# Patient Record
Sex: Female | Born: 1955 | Race: White | Hispanic: No | Marital: Married | Smoking: Former smoker
Health system: Southern US, Community
[De-identification: ages and names within clinical notes are randomized; demographics above are authoritative.]

## PROBLEM LIST (undated history)

## (undated) DIAGNOSIS — E785 Hyperlipidemia, unspecified: Secondary | ICD-10-CM

## (undated) DIAGNOSIS — I10 Essential (primary) hypertension: Secondary | ICD-10-CM

## (undated) DIAGNOSIS — F32A Depression, unspecified: Secondary | ICD-10-CM

## (undated) DIAGNOSIS — N289 Disorder of kidney and ureter, unspecified: Secondary | ICD-10-CM

## (undated) DIAGNOSIS — K219 Gastro-esophageal reflux disease without esophagitis: Secondary | ICD-10-CM

## (undated) DIAGNOSIS — L0390 Cellulitis, unspecified: Secondary | ICD-10-CM

## (undated) DIAGNOSIS — M1990 Unspecified osteoarthritis, unspecified site: Secondary | ICD-10-CM

## (undated) DIAGNOSIS — F419 Anxiety disorder, unspecified: Secondary | ICD-10-CM

## (undated) DIAGNOSIS — F329 Major depressive disorder, single episode, unspecified: Secondary | ICD-10-CM

## (undated) DIAGNOSIS — Z973 Presence of spectacles and contact lenses: Secondary | ICD-10-CM

## (undated) HISTORY — PX: SIGMOIDOSCOPY: SHX6686

## (undated) HISTORY — PX: COLONOSCOPY: SHX5424

## (undated) HISTORY — PX: WISDOM TOOTH EXTRACTION: SHX21

## (undated) HISTORY — PX: JOINT REPLACEMENT: SHX530

## (undated) HISTORY — PX: APPENDECTOMY: SHX54

## (undated) SURGERY — Surgical Case
Anesthesia: *Unknown

---

## 2001-05-17 ENCOUNTER — Other Ambulatory Visit: Admission: RE | Admit: 2001-05-17 | Discharge: 2001-05-17 | Payer: Self-pay | Admitting: Obstetrics and Gynecology

## 2001-09-13 ENCOUNTER — Other Ambulatory Visit: Admission: RE | Admit: 2001-09-13 | Discharge: 2001-09-13 | Payer: Self-pay | Admitting: Radiology

## 2012-09-18 ENCOUNTER — Encounter (HOSPITAL_COMMUNITY): Payer: Self-pay

## 2012-09-18 ENCOUNTER — Encounter (HOSPITAL_COMMUNITY)
Admission: EM | Disposition: A | Discharge status: Home / Self Care | Payer: Self-pay | Source: Home / Self Care | Attending: General Surgery

## 2012-09-18 ENCOUNTER — Emergency Department (HOSPITAL_COMMUNITY): Payer: BC Managed Care – PPO | Admitting: Anesthesiology

## 2012-09-18 ENCOUNTER — Emergency Department (HOSPITAL_COMMUNITY): Payer: BC Managed Care – PPO

## 2012-09-18 ENCOUNTER — Encounter (HOSPITAL_COMMUNITY): Payer: Self-pay | Admitting: Anesthesiology

## 2012-09-18 ENCOUNTER — Inpatient Hospital Stay (HOSPITAL_COMMUNITY)
Admission: EM | Admit: 2012-09-18 | Discharge: 2012-09-22 | DRG: 883 | Disposition: A | Discharge status: Home / Self Care | Payer: BC Managed Care – PPO | Attending: General Surgery | Admitting: General Surgery

## 2012-09-18 DIAGNOSIS — F411 Generalized anxiety disorder: Secondary | ICD-10-CM | POA: Diagnosis present

## 2012-09-18 DIAGNOSIS — F3289 Other specified depressive episodes: Secondary | ICD-10-CM | POA: Diagnosis present

## 2012-09-18 DIAGNOSIS — F329 Major depressive disorder, single episode, unspecified: Secondary | ICD-10-CM | POA: Diagnosis present

## 2012-09-18 DIAGNOSIS — K219 Gastro-esophageal reflux disease without esophagitis: Secondary | ICD-10-CM | POA: Diagnosis present

## 2012-09-18 DIAGNOSIS — I10 Essential (primary) hypertension: Secondary | ICD-10-CM | POA: Diagnosis present

## 2012-09-18 DIAGNOSIS — K35209 Acute appendicitis with generalized peritonitis, without abscess, unspecified as to perforation: Principal | ICD-10-CM | POA: Diagnosis present

## 2012-09-18 DIAGNOSIS — Z7982 Long term (current) use of aspirin: Secondary | ICD-10-CM

## 2012-09-18 DIAGNOSIS — K3580 Unspecified acute appendicitis: Secondary | ICD-10-CM

## 2012-09-18 DIAGNOSIS — Z79899 Other long term (current) drug therapy: Secondary | ICD-10-CM

## 2012-09-18 DIAGNOSIS — K352 Acute appendicitis with generalized peritonitis, without abscess: Principal | ICD-10-CM | POA: Diagnosis present

## 2012-09-18 HISTORY — PX: LAPAROSCOPIC APPENDECTOMY: SHX408

## 2012-09-18 HISTORY — DX: Essential (primary) hypertension: I10

## 2012-09-18 HISTORY — DX: Major depressive disorder, single episode, unspecified: F32.9

## 2012-09-18 HISTORY — DX: Gastro-esophageal reflux disease without esophagitis: K21.9

## 2012-09-18 HISTORY — DX: Anxiety disorder, unspecified: F41.9

## 2012-09-18 HISTORY — DX: Depression, unspecified: F32.A

## 2012-09-18 LAB — CBC WITH DIFFERENTIAL/PLATELET
Basophils Absolute: 0 10*3/uL (ref 0.0–0.1)
Eosinophils Relative: 0 % (ref 0–5)
HCT: 44.4 % (ref 36.0–46.0)
Lymphocytes Relative: 6 % — ABNORMAL LOW (ref 12–46)
Lymphs Abs: 0.6 10*3/uL — ABNORMAL LOW (ref 0.7–4.0)
MCV: 95.1 fL (ref 78.0–100.0)
Monocytes Absolute: 0.5 10*3/uL (ref 0.1–1.0)
Neutro Abs: 8.7 10*3/uL — ABNORMAL HIGH (ref 1.7–7.7)
RBC: 4.67 MIL/uL (ref 3.87–5.11)
WBC: 9.8 10*3/uL (ref 4.0–10.5)

## 2012-09-18 LAB — COMPREHENSIVE METABOLIC PANEL
ALT: 42 U/L — ABNORMAL HIGH (ref 0–35)
AST: 27 U/L (ref 0–37)
CO2: 28 mEq/L (ref 19–32)
Calcium: 10.2 mg/dL (ref 8.4–10.5)
Chloride: 95 mEq/L — ABNORMAL LOW (ref 96–112)
Creatinine, Ser: 1.1 mg/dL (ref 0.50–1.10)
GFR calc Af Amer: 65 mL/min — ABNORMAL LOW (ref >90)
GFR calc non Af Amer: 56 mL/min — ABNORMAL LOW (ref >90)
Glucose, Bld: 175 mg/dL — ABNORMAL HIGH (ref 70–99)
Sodium: 136 mEq/L (ref 135–145)
Total Bilirubin: 1.2 mg/dL (ref 0.3–1.2)

## 2012-09-18 LAB — URINE MICROSCOPIC-ADD ON

## 2012-09-18 LAB — URINALYSIS, ROUTINE W REFLEX MICROSCOPIC
Ketones, ur: 15 mg/dL — AB
Leukocytes, UA: NEGATIVE
Nitrite: NEGATIVE
Protein, ur: 30 mg/dL — AB
Urobilinogen, UA: 0.2 mg/dL (ref 0.0–1.0)

## 2012-09-18 SURGERY — APPENDECTOMY, LAPAROSCOPIC
Anesthesia: General | Site: Abdomen | Wound class: Dirty or Infected

## 2012-09-18 MED ORDER — ALBUTEROL SULFATE HFA 108 (90 BASE) MCG/ACT IN AERS
INHALATION_SPRAY | RESPIRATORY_TRACT | Status: DC | PRN
  Administered 2012-09-18: 6 via RESPIRATORY_TRACT

## 2012-09-18 MED ORDER — SUCCINYLCHOLINE CHLORIDE 20 MG/ML IJ SOLN
INTRAMUSCULAR | Status: DC | PRN
  Administered 2012-09-18: 100 mg via INTRAVENOUS

## 2012-09-18 MED ORDER — EPHEDRINE SULFATE 50 MG/ML IJ SOLN
INTRAMUSCULAR | Status: DC | PRN
  Administered 2012-09-18: 10 mg via INTRAVENOUS

## 2012-09-18 MED ORDER — ROCURONIUM BROMIDE 100 MG/10ML IV SOLN
INTRAVENOUS | Status: DC | PRN
  Administered 2012-09-18: 20 mg via INTRAVENOUS

## 2012-09-18 MED ORDER — SODIUM CHLORIDE 0.9 % IR SOLN
Status: DC | PRN
  Administered 2012-09-18: 3000 mL
  Administered 2012-09-18: 1000 mL

## 2012-09-18 MED ORDER — IOHEXOL 300 MG/ML  SOLN
50.0000 mL | Freq: Once | INTRAMUSCULAR | Status: AC | PRN
  Administered 2012-09-18: 50 mL via ORAL

## 2012-09-18 MED ORDER — ERTAPENEM SODIUM 1 G IJ SOLR
1.0000 g | Freq: Once | INTRAMUSCULAR | Status: AC
  Administered 2012-09-18: 1 g via INTRAVENOUS
  Filled 2012-09-18: qty 1

## 2012-09-18 MED ORDER — LACTATED RINGERS IV SOLN
INTRAVENOUS | Status: DC | PRN
  Administered 2012-09-18 – 2012-09-19 (×2): via INTRAVENOUS

## 2012-09-18 MED ORDER — SODIUM CHLORIDE 0.9 % IV SOLN
INTRAVENOUS | Status: DC
  Administered 2012-09-18: 19:00:00 via INTRAVENOUS

## 2012-09-18 MED ORDER — ONDANSETRON HCL 4 MG/2ML IJ SOLN
4.0000 mg | INTRAMUSCULAR | Status: AC | PRN
  Administered 2012-09-18 (×2): 4 mg via INTRAVENOUS
  Filled 2012-09-18 (×2): qty 2

## 2012-09-18 MED ORDER — ONDANSETRON HCL 4 MG/2ML IJ SOLN
INTRAMUSCULAR | Status: DC | PRN
  Administered 2012-09-18: 4 mg via INTRAVENOUS

## 2012-09-18 MED ORDER — DEXAMETHASONE SODIUM PHOSPHATE 10 MG/ML IJ SOLN
INTRAMUSCULAR | Status: DC | PRN
  Administered 2012-09-18: 8 mg via INTRAVENOUS

## 2012-09-18 MED ORDER — SODIUM CHLORIDE 0.9 % IV BOLUS (SEPSIS)
500.0000 mL | Freq: Once | INTRAVENOUS | Status: AC
  Administered 2012-09-18: 500 mL via INTRAVENOUS

## 2012-09-18 MED ORDER — FENTANYL CITRATE 0.05 MG/ML IJ SOLN
INTRAMUSCULAR | Status: DC | PRN
  Administered 2012-09-18 (×2): 50 ug via INTRAVENOUS
  Administered 2012-09-18: 100 ug via INTRAVENOUS
  Administered 2012-09-19: 50 ug via INTRAVENOUS

## 2012-09-18 MED ORDER — IOHEXOL 300 MG/ML  SOLN
100.0000 mL | Freq: Once | INTRAMUSCULAR | Status: AC | PRN
  Administered 2012-09-18: 100 mL via INTRAVENOUS

## 2012-09-18 MED ORDER — MORPHINE SULFATE 4 MG/ML IJ SOLN
4.0000 mg | INTRAMUSCULAR | Status: AC | PRN
  Administered 2012-09-18 (×2): 4 mg via INTRAVENOUS
  Filled 2012-09-18 (×2): qty 1

## 2012-09-18 MED ORDER — PROPOFOL 10 MG/ML IV BOLUS
INTRAVENOUS | Status: DC | PRN
  Administered 2012-09-18: 150 mg via INTRAVENOUS

## 2012-09-18 SURGICAL SUPPLY — 49 items
APL SKNCLS STERI-STRIP NONHPOA (GAUZE/BANDAGES/DRESSINGS)
BAG HAMPER (MISCELLANEOUS) ×2 IMPLANT
BAG SPEC RTRVL LRG 6X4 10 (ENDOMECHANICALS) ×1
BENZOIN TINCTURE PRP APPL 2/3 (GAUZE/BANDAGES/DRESSINGS) ×1 IMPLANT
CLOTH BEACON ORANGE TIMEOUT ST (SAFETY) ×2 IMPLANT
COVER LIGHT HANDLE STERIS (MISCELLANEOUS) ×4 IMPLANT
CUTTER ENDO LINEAR 45M (STAPLE) ×2 IMPLANT
DECANTER SPIKE VIAL GLASS SM (MISCELLANEOUS) ×2 IMPLANT
DEVICE TROCAR PUNCTURE CLOSURE (ENDOMECHANICALS) ×2 IMPLANT
DURAPREP 26ML APPLICATOR (WOUND CARE) ×2 IMPLANT
ELECT REM PT RETURN 9FT ADLT (ELECTROSURGICAL) ×2
ELECTRODE REM PT RTRN 9FT ADLT (ELECTROSURGICAL) ×1 IMPLANT
FILTER SMOKE EVAC LAPAROSHD (FILTER) ×2 IMPLANT
FORMALIN 10 PREFIL 120ML (MISCELLANEOUS) ×2 IMPLANT
GLOVE BIOGEL PI IND STRL 7.5 (GLOVE) ×1 IMPLANT
GLOVE BIOGEL PI INDICATOR 7.5 (GLOVE) ×1
GLOVE ECLIPSE 7.0 STRL STRAW (GLOVE) ×2 IMPLANT
GLOVE EXAM NITRILE MD LF STRL (GLOVE) ×2 IMPLANT
GOWN STRL REIN XL XLG (GOWN DISPOSABLE) ×4 IMPLANT
INST SET LAPROSCOPIC AP (KITS) ×2 IMPLANT
IV NS IRRIG 3000ML ARTHROMATIC (IV SOLUTION) ×1 IMPLANT
KIT ROOM TURNOVER APOR (KITS) ×2 IMPLANT
MANIFOLD NEPTUNE II (INSTRUMENTS) ×2 IMPLANT
NDL INSUFFLATION 14GA 120MM (NEEDLE) ×1 IMPLANT
NEEDLE INSUFFLATION 14GA 120MM (NEEDLE) ×2 IMPLANT
NS IRRIG 1000ML POUR BTL (IV SOLUTION) ×2 IMPLANT
PACK LAP CHOLE LZT030E (CUSTOM PROCEDURE TRAY) ×2 IMPLANT
PAD ARMBOARD 7.5X6 YLW CONV (MISCELLANEOUS) ×2 IMPLANT
POUCH SPECIMEN RETRIEVAL 10MM (ENDOMECHANICALS) ×2 IMPLANT
RELOAD 45 VASCULAR/THIN (ENDOMECHANICALS) IMPLANT
RELOAD STAPLE 45 2.5 WHT GRN (ENDOMECHANICALS) IMPLANT
RELOAD STAPLE 45 3.5 BLU ETS (ENDOMECHANICALS) IMPLANT
RELOAD STAPLE TA45 3.5 REG BLU (ENDOMECHANICALS) ×2 IMPLANT
SEALER TISSUE G2 CVD JAW 35 (ENDOMECHANICALS) ×1 IMPLANT
SEALER TISSUE G2 CVD JAW 45CM (ENDOMECHANICALS) ×1
SET BASIN LINEN APH (SET/KITS/TRAYS/PACK) ×2 IMPLANT
SET TUBE IRRIG SUCTION NO TIP (IRRIGATION / IRRIGATOR) ×1 IMPLANT
SLEEVE Z-THREAD 5X100MM (TROCAR) ×1 IMPLANT
SPONGE GAUZE 2X2 8PLY STRL LF (GAUZE/BANDAGES/DRESSINGS) ×3 IMPLANT
STAPLER VISISTAT 35W (STAPLE) ×1 IMPLANT
STRIP CLOSURE SKIN 1/2X4 (GAUZE/BANDAGES/DRESSINGS) ×1 IMPLANT
SUT MNCRL AB 4-0 PS2 18 (SUTURE) ×2 IMPLANT
SUT VIC AB 2-0 CT2 27 (SUTURE) ×3 IMPLANT
TAPE CLOTH SURG 4X10 WHT LF (GAUZE/BANDAGES/DRESSINGS) ×1 IMPLANT
TRAY FOLEY CATH 14FR (SET/KITS/TRAYS/PACK) ×2 IMPLANT
TROCAR Z-THAD FIOS HNDL 12X100 (TROCAR) ×2 IMPLANT
TROCAR Z-THRD FIOS HNDL 11X100 (TROCAR) ×2 IMPLANT
TROCAR Z-THREAD FIOS 5X100MM (TROCAR) ×2 IMPLANT
WARMER LAPAROSCOPE (MISCELLANEOUS) ×2 IMPLANT

## 2012-09-18 NOTE — Preoperative (Signed)
Beta Blockers   Reason not to administer Beta Blockers:Not Applicable 

## 2012-09-18 NOTE — H&P (Signed)
Julie Cameron is an 57 y.o. female.   Chief Complaint: Right lower quadrant abdominal pain HPI: Patient presented to [facility] with approximately 24 hours of increasing abdominal pain. Pain started in the upper epigastric area it is localized to the right lower quadrant. No similar symptomatology in the past. It is described as constant. It is sharp. It is worth with movement and palpation. She has had associated anorexia. Some nausea. No emesis during her stay in the emergency department. She has had associated subjective fevers and chills. No change in bowel movements. No melena or hematochezia. No sick contacts. No unusual travel.  Past Medical History  Diagnosis Date  . Hypertension   . Anxiety   . Depression   . Acid reflux     History reviewed. No pertinent past surgical history.  No family history on file. Social History:  reports that she has never smoked. She does not have any smokeless tobacco history on file. She reports that she does not drink alcohol or use illicit drugs.  Allergies: No Known Allergies   (Not in a hospital admission)  Results for orders placed during the hospital encounter of 09/18/12 (from the past 48 hour(s))  CBC WITH DIFFERENTIAL     Status: Abnormal   Collection Time    09/18/12  6:30 PM      Result Value Range   WBC 9.8  4.0 - 10.5 K/uL   RBC 4.67  3.87 - 5.11 MIL/uL   Hemoglobin 14.9  12.0 - 15.0 g/dL   HCT 21.3  08.6 - 57.8 %   MCV 95.1  78.0 - 100.0 fL   MCH 31.9  26.0 - 34.0 pg   MCHC 33.6  30.0 - 36.0 g/dL   RDW 46.9  62.9 - 52.8 %   Platelets 230  150 - 400 K/uL   Neutrophils Relative 89 (*) 43 - 77 %   Neutro Abs 8.7 (*) 1.7 - 7.7 K/uL   Lymphocytes Relative 6 (*) 12 - 46 %   Lymphs Abs 0.6 (*) 0.7 - 4.0 K/uL   Monocytes Relative 5  3 - 12 %   Monocytes Absolute 0.5  0.1 - 1.0 K/uL   Eosinophils Relative 0  0 - 5 %   Eosinophils Absolute 0.0  0.0 - 0.7 K/uL   Basophils Relative 0  0 - 1 %   Basophils Absolute 0.0  0.0 -  0.1 K/uL  COMPREHENSIVE METABOLIC PANEL     Status: Abnormal   Collection Time    09/18/12  6:30 PM      Result Value Range   Sodium 136  135 - 145 mEq/L   Potassium 4.0  3.5 - 5.1 mEq/L   Chloride 95 (*) 96 - 112 mEq/L   CO2 28  19 - 32 mEq/L   Glucose, Bld 175 (*) 70 - 99 mg/dL   BUN 20  6 - 23 mg/dL   Creatinine, Ser 4.13  0.50 - 1.10 mg/dL   Calcium 24.4  8.4 - 01.0 mg/dL   Total Protein 7.5  6.0 - 8.3 g/dL   Albumin 4.0  3.5 - 5.2 g/dL   AST 27  0 - 37 U/L   ALT 42 (*) 0 - 35 U/L   Alkaline Phosphatase 76  39 - 117 U/L   Total Bilirubin 1.2  0.3 - 1.2 mg/dL   GFR calc non Af Amer 56 (*) >90 mL/min   GFR calc Af Amer 65 (*) >90 mL/min  Comment:            The eGFR has been calculated     using the CKD EPI equation.     This calculation has not been     validated in all clinical     situations.     eGFR's persistently     <90 mL/min signify     possible Chronic Kidney Disease.  LIPASE, BLOOD     Status: None   Collection Time    09/18/12  6:30 PM      Result Value Range   Lipase 13  11 - 59 U/L  TROPONIN I     Status: None   Collection Time    09/18/12  6:30 PM      Result Value Range   Troponin I <0.30  <0.30 ng/mL   Comment:            Due to the release kinetics of cTnI,     a negative result within the first hours     of the onset of symptoms does not rule out     myocardial infarction with certainty.     If myocardial infarction is still suspected,     repeat the test at appropriate intervals.  URINALYSIS, ROUTINE W REFLEX MICROSCOPIC     Status: Abnormal   Collection Time    09/18/12  6:46 PM      Result Value Range   Color, Urine YELLOW  YELLOW   APPearance CLEAR  CLEAR   Specific Gravity, Urine 1.020  1.005 - 1.030   pH 6.5  5.0 - 8.0   Glucose, UA NEGATIVE  NEGATIVE mg/dL   Hgb urine dipstick TRACE (*) NEGATIVE   Bilirubin Urine MODERATE (*) NEGATIVE   Ketones, ur 15 (*) NEGATIVE mg/dL   Protein, ur 30 (*) NEGATIVE mg/dL   Urobilinogen, UA 0.2   0.0 - 1.0 mg/dL   Nitrite NEGATIVE  NEGATIVE   Leukocytes, UA NEGATIVE  NEGATIVE  URINE MICROSCOPIC-ADD ON     Status: Abnormal   Collection Time    09/18/12  6:46 PM      Result Value Range   Squamous Epithelial / LPF MANY (*) RARE   WBC, UA 3-6  <3 WBC/hpf   RBC / HPF 3-6  <3 RBC/hpf   Bacteria, UA RARE  RARE   Ct Abdomen Pelvis W Contrast  09/18/2012  *RADIOLOGY REPORT*  Clinical Data: Abdominal pain.  History of acid reflux.  CT ABDOMEN AND PELVIS WITH CONTRAST  Technique:  Multidetector CT imaging of the abdomen and pelvis was performed following the standard protocol during bolus administration of intravenous contrast.  Contrast: 50mL OMNIPAQUE IOHEXOL 300 MG/ML  SOLN, OMNIPAQUE IOHEXOL 300 MG/ML  SOLN 100 ml Omnipaque-300.  Comparison: None.  Findings: Dependent atelectasis is seen in the lung bases. Contrast material present in the distal esophagus consistent with reflux and/or poor motility.  Small hiatal hernia is present.  The liver is diffusely low attenuating consistent with fatty infiltration.  A 1.1 cm low attenuating lesion in the left hepatic lobe cannot be definitively characterized but is likely a small cyst.  The spleen, gallbladder, biliary tree, adrenal glands, pancreas and kidneys all appear normal.  The appendix is markedly thickened with extensive periappendiceal inflammatory change consistent with acute appendicitis.  There is a small volume of free pelvic fluid.  No focal fluid collection is identified.  The stomach and colon appear normal.  There is mild prominence of small bowel loops  compatible with ileus.  No lymphadenopathy is identified.  There is no focal bony abnormality.  IMPRESSION:  1.  Study is positive for acute appendicitis without abscess or perforation. 2.  Contrast material in the distal esophagus compatible with reflux and/or poor motility. 3.  Small hiatal hernia. 4.  Fatty infiltration of the liver.  Critical Value/emergent results were called by  telephone at the time of interpretation on 09/18/2012 at 8:40 p.m. to Dr. Deretha Emory, who verbally acknowledged these results.   Original Report Authenticated By: Holley Dexter, M.D.     Review of Systems  Constitutional: Positive for fever and chills.  HENT: Negative.   Eyes: Negative.   Respiratory: Negative.   Cardiovascular: Negative.   Gastrointestinal: Positive for nausea, vomiting and abdominal pain. Negative for diarrhea (diffuse with localization to the right lower quadrant), constipation, blood in stool and melena.  Genitourinary: Negative.   Musculoskeletal: Negative.   Skin: Negative.   Neurological: Negative.   Endo/Heme/Allergies: Negative.   Psychiatric/Behavioral: Negative.     Blood pressure 96/69, pulse 73, temperature 98.5 F (36.9 C), temperature source Oral, resp. rate 21, height 5\' 3"  (1.6 m), weight 79.379 kg (175 lb), SpO2 96.00%. Physical Exam  Constitutional: She is oriented to person, place, and time. She appears well-developed and well-nourished. No distress.  HENT:  Head: Normocephalic and atraumatic.  Eyes: Conjunctivae and EOM are normal. Pupils are equal, round, and reactive to light. No scleral icterus.  Neck: Normal range of motion. Neck supple. No tracheal deviation present. No thyromegaly present.  Cardiovascular: Normal rate, regular rhythm and normal heart sounds.   Respiratory: Effort normal and breath sounds normal. No respiratory distress.  GI: Soft. She exhibits no distension and no mass. There is tenderness (right lower quadrant at McBurney's point. Positive Rovsing sign.). There is rebound and guarding.  Lymphadenopathy:    She has no cervical adenopathy.  Neurological: She is alert and oriented to person, place, and time.  Skin: Skin is warm and dry.     Assessment/Plan Acute appendicitis. Clinical findings as well as CT findings were discussed with the patient. Risks benefits alternatives a laparoscopic possible open appendectomy  were discussed at length the patient and her husband who is present during the discussion and evaluation. Patient will be continued in an n.p.o. status. Continue IV fluid hydration. Continued on DVT prophylaxis. Pincus Sanes has been started by the emergency room physician by my request for antibiotic coverage. Risks benefits alternatives a laparoscopic appendectomy were discussed including but not limited to risk of bleeding, infection, appendiceal stump leak, intraoperative cardiac and pulmonary events. We will proceed to the operating room for the planned emergent appendectomy.  ZIEGLER,[name] 09/18/2012, 9:59 PM

## 2012-09-18 NOTE — ED Provider Notes (Signed)
History     CSN: [id number]  Arrival date & time 09/18/12  1522   First MD Initiated Contact with Patient 09/18/12 1745      Chief Complaint  Patient presents with  . Abdominal Pain    HPI Pt was seen at 1810.   Per pt, c/o gradual onset and persistence of constant generalized abd "pain" for the past 2 days.  Has been associated with nausea and one episode of vomiting.  States the pain was generalized, now located more on the right side of her abd. Denies diarrhea, no fevers, no back pain, no rash, no CP/SOB, no black or blood in stools or emesis, no dysuria/hematuria, no vaginal bleeding/discharge.       Past Medical History  Diagnosis Date  . Hypertension   . Anxiety   . Depression   . Acid reflux     History reviewed. No pertinent past surgical history.   History  Substance Use Topics  . Smoking status: Never Smoker   . Smokeless tobacco: Not on file  . Alcohol Use: No    Review of Systems ROS: Statement: All systems negative except as marked or noted in the HPI; Constitutional: Negative for fever and chills. ; ; Eyes: Negative for eye pain, redness and discharge. ; ; ENMT: Negative for ear pain, hoarseness, nasal congestion, sinus pressure and sore throat. ; ; Cardiovascular: Negative for chest pain, palpitations, diaphoresis, dyspnea and peripheral edema. ; ; Respiratory: Negative for cough, wheezing and stridor. ; ; Gastrointestinal: +N/V, abd pain. Negative for diarrhea, blood in stool, hematemesis, jaundice and rectal bleeding. . ; ; Genitourinary: Negative for dysuria, flank pain and hematuria. ; ; Musculoskeletal: Negative for back pain and neck pain. Negative for swelling and trauma.; ; Skin: Negative for pruritus, rash, abrasions, blisters, bruising and skin lesion.; ; Neuro: Negative for headache, lightheadedness and neck stiffness. Negative for weakness, altered level of consciousness , altered mental status, extremity weakness, paresthesias, involuntary movement,  seizure and syncope.       Allergies  Review of patient's allergies indicates no known allergies.  Home Medications   Current Outpatient Rx  Name  Route  Sig  Dispense  Refill  . aspirin EC 81 MG tablet   Oral   Take 81 mg by mouth every evening.         Marland Kitchen buPROPion (WELLBUTRIN XL) 150 MG 24 hr tablet   Oral   Take 150 mg by mouth every morning.         . escitalopram (LEXAPRO) 10 MG tablet   Oral   Take 10 mg by mouth every morning.         . Eszopiclone (LUNESTA PO)   Oral   Take 1 tablet by mouth at bedtime.         . hydrochlorothiazide (HYDRODIURIL) 25 MG tablet   Oral   Take 25 mg by mouth every morning.         Marland Kitchen lisinopril (PRINIVIL,ZESTRIL) 10 MG tablet   Oral   Take 10 mg by mouth every morning.         . Multiple Vitamin (MULTIVITAMIN WITH MINERALS) TABS   Oral   Take 1 tablet by mouth at bedtime.         . pantoprazole (PROTONIX) 40 MG tablet   Oral   Take 40 mg by mouth every morning.         Marland Kitchen Propylene Glycol (SYSTANE BALANCE) 0.6 % SOLN   Ophthalmic   Apply 1-2  drops to eye daily.           BP 123/77  Pulse 88  Temp(Src) 98.5 F (36.9 C) (Oral)  Resp 20  Ht 5\' 3"  (1.6 m)  Wt 175 lb (79.379 kg)  BMI 31.01 kg/m2  SpO2 96%  Physical Exam 1815: Physical examination:  Nursing notes reviewed; Vital signs and O2 SAT reviewed;  Constitutional: Well developed, Well nourished, Well hydrated, Uncomfortable appearing; Head:  Normocephalic, atraumatic; Eyes: EOMI, PERRL, No scleral icterus; ENMT: Mouth and pharynx normal, Mucous membranes moist; Neck: Supple, Full range of motion, No lymphadenopathy; Cardiovascular: Regular rate and rhythm, No murmur, rub, or gallop; Respiratory: Breath sounds clear & equal bilaterally, No rales, rhonchi, wheezes.  Speaking full sentences with ease, Normal respiratory effort/excursion; Chest: Nontender, Movement normal; Abdomen: Soft, +diffuse tenderness to palp, esp RLQ. Nondistended, Decreased bowel  sounds;; Extremities: Pulses normal, No tenderness, No edema, No calf edema or asymmetry.; Neuro: AA&Ox3, Major CN grossly intact.  Speech clear. No gross focal motor or sensory deficits in extremities.; Skin: Color normal, Warm, Dry.    ED Course  Procedures     MDM  MDM Reviewed: previous chart, nursing note and vitals Reviewed previous: labs and ECG Interpretation: labs, ECG and CT scan      Date: 09/18/2012  Rate: 75  Rhythm: normal sinus rhythm  QRS Axis: left  Intervals: normal  ST/T Wave abnormalities: normal  Conduction Disutrbances:none  Narrative Interpretation:   Old EKG Reviewed: none available.  Results for orders placed during the hospital encounter of 09/18/12  URINALYSIS, ROUTINE W REFLEX MICROSCOPIC      Result Value Range   Color, Urine YELLOW  YELLOW   APPearance CLEAR  CLEAR   Specific Gravity, Urine 1.020  1.005 - 1.030   pH 6.5  5.0 - 8.0   Glucose, UA NEGATIVE  NEGATIVE mg/dL   Hgb urine dipstick TRACE (*) NEGATIVE   Bilirubin Urine MODERATE (*) NEGATIVE   Ketones, ur 15 (*) NEGATIVE mg/dL   Protein, ur 30 (*) NEGATIVE mg/dL   Urobilinogen, UA 0.2  0.0 - 1.0 mg/dL   Nitrite NEGATIVE  NEGATIVE   Leukocytes, UA NEGATIVE  NEGATIVE  CBC WITH DIFFERENTIAL      Result Value Range   WBC 9.8  4.0 - 10.5 K/uL   RBC 4.67  3.87 - 5.11 MIL/uL   Hemoglobin 14.9  12.0 - 15.0 g/dL   HCT 16.1  09.6 - 04.5 %   MCV 95.1  78.0 - 100.0 fL   MCH 31.9  26.0 - 34.0 pg   MCHC 33.6  30.0 - 36.0 g/dL   RDW 40.9  81.1 - 91.4 %   Platelets 230  150 - 400 K/uL   Neutrophils Relative 89 (*) 43 - 77 %   Neutro Abs 8.7 (*) 1.7 - 7.7 K/uL   Lymphocytes Relative 6 (*) 12 - 46 %   Lymphs Abs 0.6 (*) 0.7 - 4.0 K/uL   Monocytes Relative 5  3 - 12 %   Monocytes Absolute 0.5  0.1 - 1.0 K/uL   Eosinophils Relative 0  0 - 5 %   Eosinophils Absolute 0.0  0.0 - 0.7 K/uL   Basophils Relative 0  0 - 1 %   Basophils Absolute 0.0  0.0 - 0.1 K/uL  COMPREHENSIVE METABOLIC PANEL       Result Value Range   Sodium 136  135 - 145 mEq/L   Potassium 4.0  3.5 - 5.1 mEq/L   Chloride 95 (*)  96 - 112 mEq/L   CO2 28  19 - 32 mEq/L   Glucose, Bld 175 (*) 70 - 99 mg/dL   BUN 20  6 - 23 mg/dL   Creatinine, Ser 1.61  0.50 - 1.10 mg/dL   Calcium 09.6  8.4 - 04.5 mg/dL   Total Protein 7.5  6.0 - 8.3 g/dL   Albumin 4.0  3.5 - 5.2 g/dL   AST 27  0 - 37 U/L   ALT 42 (*) 0 - 35 U/L   Alkaline Phosphatase 76  39 - 117 U/L   Total Bilirubin 1.2  0.3 - 1.2 mg/dL   GFR calc non Af Amer 56 (*) >90 mL/min   GFR calc Af Amer 65 (*) >90 mL/min  LIPASE, BLOOD      Result Value Range   Lipase 13  11 - 59 U/L  TROPONIN I      Result Value Range   Troponin I <0.30  <0.30 ng/mL  URINE MICROSCOPIC-ADD ON      Result Value Range   Squamous Epithelial / LPF MANY (*) RARE   WBC, UA 3-6  <3 WBC/hpf   RBC / HPF 3-6  <3 RBC/hpf   Bacteria, UA RARE  RARE   Ct Abdomen Pelvis W Contrast 09/18/2012  *RADIOLOGY REPORT*  Clinical Data: Abdominal pain.  History of acid reflux.  CT ABDOMEN AND PELVIS WITH CONTRAST  Technique:  Multidetector CT imaging of the abdomen and pelvis was performed following the standard protocol during bolus administration of intravenous contrast.  Contrast: 50mL OMNIPAQUE IOHEXOL 300 MG/ML  SOLN, OMNIPAQUE IOHEXOL 300 MG/ML  SOLN 100 ml Omnipaque-300.  Comparison: None.  Findings: Dependent atelectasis is seen in the lung bases. Contrast material present in the distal esophagus consistent with reflux and/or poor motility.  Small hiatal hernia is present.  The liver is diffusely low attenuating consistent with fatty infiltration.  A 1.1 cm low attenuating lesion in the left hepatic lobe cannot be definitively characterized but is likely a small cyst.  The spleen, gallbladder, biliary tree, adrenal glands, pancreas and kidneys all appear normal.  The appendix is markedly thickened with extensive periappendiceal inflammatory change consistent with acute appendicitis.  There is a  small volume of free pelvic fluid.  No focal fluid collection is identified.  The stomach and colon appear normal.  There is mild prominence of small bowel loops compatible with ileus.  No lymphadenopathy is identified.  There is no focal bony abnormality.  IMPRESSION:  1.  Study is positive for acute appendicitis without abscess or perforation. 2.  Contrast material in the distal esophagus compatible with reflux and/or poor motility. 3.  Small hiatal hernia. 4.  Fatty infiltration of the liver.  Critical Value/emergent results were called by telephone at the time of interpretation on 09/18/2012 at 8:40 p.m. to Dr. Deretha Emory, who verbally acknowledged these results.   Original Report Authenticated By: Holley Dexter, M.D.      [phone]:  Will start IV invanz. Will keep pt NPO (states she has not had anything to eat/drink x2 days). Dx and testing d/w pt and family.  Questions answered.  Verb understanding, agreeable to admit.  T/C to General Surgeon Dr. Leticia Penna, case discussed, including:  HPI, pertinent PM/SHx, VS/PE, dx testing, ED course and treatment:  Agreeable to admit, requests he will come to ED for eval.        Laray Anger, DO 09/20/12 1259

## 2012-09-18 NOTE — Anesthesia Procedure Notes (Signed)
Procedure Name: Intubation Date/Time: 09/18/2012 11:10 PM Performed by: Carolyne Littles, AMY L Pre-anesthesia Checklist: Patient identified, Patient being monitored, Timeout performed, Emergency Drugs available and Suction available Patient Re-evaluated:Patient Re-evaluated prior to inductionOxygen Delivery Method: Circle System Utilized Preoxygenation: Pre-oxygenation with 100% oxygen Intubation Type: IV induction, Rapid sequence and Cricoid Pressure applied Ventilation: Mask ventilation without difficulty Laryngoscope Size: 3 and Miller Grade View: Grade I Tube type: Oral Tube size: 7.0 mm Number of attempts: 1 Airway Equipment and Method: stylet Placement Confirmation: ETT inserted through vocal cords under direct vision,  positive ETCO2 and breath sounds checked- equal and bilateral Secured at: 21 cm Tube secured with: Tape Dental Injury: Teeth and Oropharynx as per pre-operative assessment

## 2012-09-18 NOTE — Anesthesia Preprocedure Evaluation (Addendum)
Anesthesia Evaluation  Patient identified by MRN, date of birth, ID band Patient awake    Reviewed: Allergy & Precautions, H&P , NPO status , Patient's Chart, lab work & pertinent test results, reviewed documented beta blocker date and time   Airway Mallampati: II TM Distance: >3 FB Neck ROM: Full    Dental  (+) Teeth Intact   Pulmonary  breath sounds clear to auscultation        Cardiovascular hypertension, Rhythm:Regular Rate:Normal     Neuro/Psych Anxiety Depression    GI/Hepatic GERD-  Medicated and Controlled,Drank oral contrast at 7:00pm; nausea and vomiting   Endo/Other    Renal/GU      Musculoskeletal   Abdominal   Peds  Hematology   Anesthesia Other Findings   Reproductive/Obstetrics                          Anesthesia Physical Anesthesia Plan  ASA: II and emergent  Anesthesia Plan: General   Post-op Pain Management:    Induction: Intravenous, Rapid sequence and Cricoid pressure planned  Airway Management Planned: Oral ETT  Additional Equipment:   Intra-op Plan:   Post-operative Plan: Extubation in OR  Informed Consent: I have reviewed the patients History and Physical, chart, labs and discussed the procedure including the risks, benefits and alternatives for the proposed anesthesia with the patient or authorized representative who has indicated his/her understanding and acceptance.   Dental advisory given  Plan Discussed with: Anesthesiologist and Surgeon  Anesthesia Plan Comments:         Anesthesia Quick Evaluation

## 2012-09-18 NOTE — ED Notes (Signed)
Pt reports ab pain that started yesterday, +nausea at times, no fever, no diarrhea. Last normal bm Friday. Pain at first was generalized, has now settled to right lower quad and mid-line.  Denies any urinary s/s, no appetite.

## 2012-09-18 NOTE — ED Notes (Addendum)
Pt c/o abdominal pain x2 days. Pt states pain began in upper abdomen but now radiates to RLQ. Pt had one episode of vomiting yesterday. Pt's last BM was Friday but pt states "it's normal" for her to not have a BM everyday. Pt denies any urinary changes. Pt states "at first I thought it was heartburn but now it hurts lower".

## 2012-09-19 ENCOUNTER — Inpatient Hospital Stay (HOSPITAL_COMMUNITY): Payer: BC Managed Care – PPO

## 2012-09-19 LAB — CBC
HCT: 40.2 % (ref 36.0–46.0)
MCH: 31.9 pg (ref 26.0–34.0)
MCHC: 33.1 g/dL (ref 30.0–36.0)
MCHC: 33 g/dL (ref 30.0–36.0)
Platelets: 207 10*3/uL (ref 150–400)
Platelets: 192 10*3/uL (ref 150–400)
RBC: 4.27 MIL/uL (ref 3.87–5.11)
RDW: 12.8 % (ref 11.5–15.5)
WBC: 3.4 10*3/uL — ABNORMAL LOW (ref 4.0–10.5)

## 2012-09-19 LAB — COMPREHENSIVE METABOLIC PANEL
ALT: 32 U/L (ref 0–35)
Alkaline Phosphatase: 56 U/L (ref 39–117)
BUN: 21 mg/dL (ref 6–23)
CO2: 28 mEq/L (ref 19–32)
Chloride: 97 mEq/L (ref 96–112)
GFR calc Af Amer: 61 mL/min — ABNORMAL LOW (ref >90)
Glucose, Bld: 177 mg/dL — ABNORMAL HIGH (ref 70–99)
Potassium: 4.5 mEq/L (ref 3.5–5.1)
Sodium: 135 mEq/L (ref 135–145)
Total Bilirubin: 0.9 mg/dL (ref 0.3–1.2)
Total Protein: 6.2 g/dL (ref 6.0–8.3)

## 2012-09-19 LAB — BASIC METABOLIC PANEL
CO2: 27 mEq/L (ref 19–32)
Calcium: 8.9 mg/dL (ref 8.4–10.5)
GFR calc non Af Amer: 51 mL/min — ABNORMAL LOW (ref >90)
Potassium: 4.3 mEq/L (ref 3.5–5.1)
Sodium: 136 mEq/L (ref 135–145)

## 2012-09-19 MED ORDER — POVIDONE-IODINE 10 % OINT PACKET
TOPICAL_OINTMENT | CUTANEOUS | Status: DC | PRN
  Administered 2012-09-19: 1 via TOPICAL

## 2012-09-19 MED ORDER — HYDROMORPHONE HCL PF 1 MG/ML IJ SOLN
1.0000 mg | INTRAMUSCULAR | Status: DC | PRN
  Administered 2012-09-19: 2 mg via INTRAVENOUS
  Administered 2012-09-19 (×2): 1 mg via INTRAVENOUS
  Administered 2012-09-19: 2 mg via INTRAVENOUS
  Administered 2012-09-20 – 2012-09-21 (×2): 1 mg via INTRAVENOUS
  Filled 2012-09-19: qty 1
  Filled 2012-09-19 (×3): qty 2
  Filled 2012-09-19 (×2): qty 1

## 2012-09-19 MED ORDER — ONDANSETRON HCL 4 MG/2ML IJ SOLN: 4.0000 mg | Freq: Four times a day (QID) | INTRAMUSCULAR | Status: DC | PRN

## 2012-09-19 MED ORDER — HYDROCODONE-ACETAMINOPHEN 5-325 MG PO TABS
1.0000 | ORAL_TABLET | ORAL | Status: DC | PRN
  Administered 2012-09-19 – 2012-09-20 (×5): 2 via ORAL
  Administered 2012-09-20: 1 via ORAL
  Administered 2012-09-22: 2 via ORAL
  Filled 2012-09-19 (×4): qty 2
  Filled 2012-09-19: qty 1
  Filled 2012-09-19 (×2): qty 2

## 2012-09-19 MED ORDER — BUPIVACAINE HCL 0.5 % IJ SOLN
INTRAMUSCULAR | Status: DC | PRN
  Administered 2012-09-19: 10 mL

## 2012-09-19 MED ORDER — PANTOPRAZOLE SODIUM 40 MG PO TBEC
40.0000 mg | DELAYED_RELEASE_TABLET | Freq: Every morning | ORAL | Status: DC
  Administered 2012-09-19 – 2012-09-22 (×4): 40 mg via ORAL
  Filled 2012-09-19 (×4): qty 1

## 2012-09-19 MED ORDER — LACTATED RINGERS IV SOLN
INTRAVENOUS | Status: DC
  Administered 2012-09-19 – 2012-09-20 (×4): via INTRAVENOUS

## 2012-09-19 MED ORDER — BUPROPION HCL ER (XL) 150 MG PO TB24
150.0000 mg | ORAL_TABLET | Freq: Every morning | ORAL | Status: DC
  Administered 2012-09-19 – 2012-09-22 (×4): 150 mg via ORAL
  Filled 2012-09-19 (×7): qty 1

## 2012-09-19 MED ORDER — NEOSTIGMINE METHYLSULFATE 1 MG/ML IJ SOLN
INTRAMUSCULAR | Status: DC | PRN
  Administered 2012-09-19: 4 mg via INTRAVENOUS
  Administered 2012-09-19: 1 mg via INTRAVENOUS

## 2012-09-19 MED ORDER — SODIUM CHLORIDE 0.9 % IV SOLN
1.0000 g | INTRAVENOUS | Status: DC
  Administered 2012-09-19 – 2012-09-21 (×3): 1 g via INTRAVENOUS
  Filled 2012-09-19 (×4): qty 1

## 2012-09-19 MED ORDER — ENOXAPARIN SODIUM 40 MG/0.4ML ~~LOC~~ SOLN
40.0000 mg | SUBCUTANEOUS | Status: DC
  Administered 2012-09-19 – 2012-09-22 (×4): 40 mg via SUBCUTANEOUS
  Filled 2012-09-19 (×4): qty 0.4

## 2012-09-19 MED ORDER — LISINOPRIL 10 MG PO TABS
10.0000 mg | ORAL_TABLET | Freq: Every morning | ORAL | Status: DC
  Administered 2012-09-19 – 2012-09-22 (×3): 10 mg via ORAL
  Filled 2012-09-19 (×4): qty 1

## 2012-09-19 MED ORDER — ESCITALOPRAM OXALATE 10 MG PO TABS
10.0000 mg | ORAL_TABLET | Freq: Every morning | ORAL | Status: DC
  Administered 2012-09-19 – 2012-09-22 (×4): 10 mg via ORAL
  Filled 2012-09-19 (×4): qty 1

## 2012-09-19 MED ORDER — GLYCOPYRROLATE 0.2 MG/ML IJ SOLN
INTRAMUSCULAR | Status: DC | PRN
  Administered 2012-09-19: 0.6 mg via INTRAVENOUS

## 2012-09-19 NOTE — Progress Notes (Signed)
Patient ID: Julie Cameron, female   DOB: [birth year], 57 y.o.   MRN: [id number]   Patient weak upon reversal from anesthesia.  Required assistance with ventilation initially.  Saturations remain in upper 90's on nonrebreather.  Lungs clear.  Awake and responding to questions and commands but weak verbalization and movements. Patient is moving all extremities both spontaneously as well as to commands.  Due to patient's level and post anesthesia course will monitor in Step-down unit.  Question pseudocholinesterase deficiency.  Will check CXR in unit.

## 2012-09-19 NOTE — Addendum Note (Signed)
Addendum created 09/19/12 0138 by Marolyn Hammock, CRNA   Modules edited: Anesthesia Medication Administration

## 2012-09-19 NOTE — Anesthesia Postprocedure Evaluation (Signed)
  Anesthesia Post-op Note  Patient: Julie Cameron  Procedure(s) Performed: Procedure(s): APPENDECTOMY LAPAROSCOPIC (N/A)  Patient Location: ICU  Anesthesia Type:General  Level of Consciousness: awake, alert , oriented and patient cooperative  Airway and Oxygen Therapy: Patient Spontanous Breathing and Patient connected to face mask oxygen  Post-op Pain: mild  Post-op Assessment: Post-op Vital signs reviewed, Patient's Cardiovascular Status Stable, Respiratory Function Stable, Patent Airway, No signs of Nausea or vomiting and Pain level controlled  Post-op Vital Signs: Reviewed and stable  Complications: No apparent anesthesia complications

## 2012-09-19 NOTE — Progress Notes (Signed)
1 Day Post-Op  Subjective: Pain controlled.  No nausea.  No fevers.  Tolerating PO.    Objective: Vital signs in last 24 hours: Temp:  [99 F (37.2 Cameron)-100.3 F (37.9 Cameron)] 99.1 F (37.3 Cameron) (03/10 2000) Pulse Rate:  [65-93] 72 (03/10 2000) Resp:  [12-24] 17 (03/10 2153) BP: (88-138)/(47-82) 88/54 mmHg (03/10 2100) SpO2:  [90 %-97 %] 97 % (03/10 2000) Weight:  [85.6 kg (188 lb 11.4 oz)-85.9 kg (189 lb 6 oz)] 85.9 kg (189 lb 6 oz) (03/10 0500) Last BM Date: 09/18/12  Intake/Output from previous day: 03/09 0701 - 03/10 0700 In: 1830 [P.O.:50; I.V.:1780] Out: 400 [Urine:100] Intake/Output this shift: Total I/O In: 125 [I.V.:75; IV Piggyback:50] Out: -   General appearance: alert and no distress GI: +BS, soft, expected tenderness.  Incision Cameron/d/i.  Lab Results:   Recent Labs  09/19/12 0201 09/19/12 0440  WBC 3.4* 4.4  HGB 13.3 13.6  HCT 40.2 41.2  PLT 207 192   BMET  Recent Labs  09/19/12 0201 09/19/12 0440  NA 135 136  K 4.5 4.3  CL 97 99  CO2 28 27  GLUCOSE 177* 147*  BUN 21 21  CREATININE 1.16* 1.19*  CALCIUM 8.7 8.9   PT/INR No results found for this basename: LABPROT, INR,  in the last 72 hours ABG No results found for this basename: PHART, PCO2, PO2, HCO3,  in the last 72 hours  Studies/Results: Ct Abdomen Pelvis W Contrast  09/18/2012  *RADIOLOGY REPORT*  Clinical Data: Abdominal pain.  History of acid reflux.  CT ABDOMEN AND PELVIS WITH CONTRAST  Technique:  Multidetector CT imaging of the abdomen and pelvis was performed following the standard protocol during bolus administration of intravenous contrast.  Contrast: 50mL OMNIPAQUE IOHEXOL 300 MG/ML  SOLN, OMNIPAQUE IOHEXOL 300 MG/ML  SOLN 100 ml Omnipaque-300.  Comparison: None.  Findings: Dependent atelectasis is seen in the lung bases. Contrast material present in the distal esophagus consistent with reflux and/or poor motility.  Small hiatal hernia is present.  The liver is diffusely low  attenuating consistent with fatty infiltration.  A 1.1 cm low attenuating lesion in the left hepatic lobe cannot be definitively characterized but is likely a small cyst.  The spleen, gallbladder, biliary tree, adrenal glands, pancreas and kidneys all appear normal.  The appendix is markedly thickened with extensive periappendiceal inflammatory change consistent with acute appendicitis.  There is a small volume of free pelvic fluid.  No focal fluid collection is identified.  The stomach and colon appear normal.  There is mild prominence of small bowel loops compatible with ileus.  No lymphadenopathy is identified.  There is no focal bony abnormality.  IMPRESSION:  1.  Study is positive for acute appendicitis without abscess or perforation. 2.  Contrast material in the distal esophagus compatible with reflux and/or poor motility. 3.  Small hiatal hernia. 4.  Fatty infiltration of the liver.  Critical Value/emergent results were called by telephone at the time of interpretation on 09/18/2012 at 8:40 p.m. to Dr. Deretha Cameron, who verbally acknowledged these results.   Original Report Authenticated By: Julie Cameron, M.D.    Dg Chest Port 1 View  09/19/2012  *RADIOLOGY REPORT*  Clinical Data: Lobe postoperative oxygen saturation  PORTABLE CHEST - 1 VIEW  Comparison: 09/28/2012 abdomen CT  Findings: Hypoaeration.  Elevated right hemidiaphragm.  Nonspecific bibasilar opacities. Cardiomediastinal contours within normal range.  No pleural effusion or pneumothorax.  No acute osseous finding.  IMPRESSION: Hypoaeration with mild bibasilar opacities, favor  atelectasis.   Original Report Authenticated By: Julie Cameron, M.D.     Anti-infectives: Anti-infectives   Start     Dose/Rate Route Frequency Ordered Stop   09/19/12 2000  ertapenem (INVANZ) 1 g in sodium chloride 0.9 % 50 mL IVPB     1 g 100 mL/hr over 30 Minutes Intravenous Every 24 hours 09/19/12 0201     09/18/12 2100  ertapenem (INVANZ) 1 g in sodium  chloride 0.9 % 50 mL IVPB     1 g 100 mL/hr over 30 Minutes Intravenous  Once 09/18/12 2050 09/18/12 2200      Assessment/Plan: s/p Procedure(s): APPENDECTOMY LAPAROSCOPIC (N/A) Doing well.  Continue abx.  Continue diet advancement as tolerated.    LOS: 1 day    Julie Cameron 09/19/2012

## 2012-09-19 NOTE — Progress Notes (Signed)
UR chart review completed.  

## 2012-09-19 NOTE — Op Note (Signed)
Patient:  Julie Cameron  DOB:  Oct 08, 1955  MRN:  [id number]   Preop Diagnosis:  Acute appendicitis  Postop Diagnosis:  Ruptured appendicitis  Procedure:  Laparoscopic appendectomy  Surgeon:  Dr. Tilford Pillar  Anes:  General endotracheal, 0.5% Sensorcaine plain for local  Indications:  Patient is a 57 year old female presented to [facility] with a history of right lower quadrant abdominal pain. Workup and evaluation was consistent for acute appendicitis. Risks benefits alternatives a laparoscopic possible open appendectomy were discussed at length patient including but not limited to risk of bleeding, infection, appendiceal stump leak, intraoperative cardiac and pulmonary events. Patient's questions and concerns are addressed the patient was consented for the planned procedure.  Procedure note:  Patient is taken to the operating room was placed in supine position the or table time the general anesthetic is administered. Once patient was asleep symmetrically intubated by the nurse anesthetist. At this point a Foley catheter is placed in standard sterile fashion by the operative staff. Her abdomen is prepped with DuraPrep solution. Drapes are placed in standard fashion. Time out was performed. Stab incision was created supraumbilically with 11 blade scalpel with additional dissection down to subcutaneous tissue carried out using a Coker clamp. The clamp was utilized grasp the anterior normal fascia with this anteriorly. A Veress he was inserted saline drop test is utilized confirm intraperitoneal placement the pneumoperitoneum was initiated. Once sufficient pneumoperitoneum was obtained a 12 mm insert overlap scope line visualization the trocar entering into the peritoneal cavity. At this point the inner cannulas removed lap scope was reinserted there is no evidence of any trocar or Veress needle placement injury. At this time the remaining trochars replaced a 5 mm in the suprapubic region and  a 11 mm trocar in the left lateral abdominal wall. Patient's placed into a Trendelenburg position and as well as a left lateral decubitus position. The tinea or fall down to the base the appendix. At this point did encounter a significant amount of periodic material. It is obvious that the appendix had RE ruptured. At this time I did bring a suction irrigator to the field and did aspirate the purulent secretions. The mid body of the appendix appeared to be necrotic with evidence of perforation at this point. I was able to dissect a window between the appendix and mesoappendix at the base of the appendix. A GIA Endo 45 mm standard stapler was utilized to divide the base the appendix. The mesoappendix is divided using the Enseal bipolar device. At this point the appendix is free. It is placed into an Endo Catch bag and placed up into the right upper quadrant. At this point copiously irrigated the pelvis. The appendiceal stump is expected and there is no evidence of any bleeding or leak. The mesoappendix is noted be hemostatic. At this time and the returning aspirate was noted be clear from the pelvis including the retrouterine space. I termite attention to closure.  Using an Endo Close suture passing device a 2-0 Vicryl sutures passed through both the 11 and 12 mm car sites. With the sutures in place the appendix is retrieved was removed through the umbilical trocar site and intact Endo Catch bag. It was placed in the back table and sent as a perm specimen to pathology. At this time the pneumoperitoneum was evacuated. Trochars were removed. The Vicryl sutures were secured. The local anesthetic is instilled. Skin staples he utilized reapproximate the skin edges. The skin was washed dried moist dry towel. Sterile  dressings are placed after Betadine ointment was placed over the incisions. The drapes removed the dressings were secured. The patient was allowed to come out of general anesthetic and stretcher to the PACU  in stable condition. At the conclusion of procedure all instrument, sponge, needle counts are correct. Patient tolerated procedure extremely well.  Complications:  None apparent  EBL:  Less than 100 ML's  Specimen:  Appendix

## 2012-09-19 NOTE — Transfer of Care (Signed)
Immediate Anesthesia Transfer of Care Note  Patient: Julie Cameron  Procedure(s) Performed: Procedure(s): APPENDECTOMY LAPAROSCOPIC (N/A)  Patient Location: ICU  Anesthesia Type:General  Level of Consciousness: awake, alert , oriented and patient cooperative  Airway & Oxygen Therapy: Patient Spontanous Breathing, Patient connected to face mask oxygen and non-rebreather face mask  Post-op Assessment: Report given to PACU RN, Post -op Vital signs reviewed and stable and Patient moving all extremities X 4  Post vital signs: Reviewed and stable  Complications: No apparent anesthesia complications

## 2012-09-19 NOTE — Anesthesia Postprocedure Evaluation (Signed)
  Anesthesia Post-op Note  Patient: Julie Cameron  Procedure(s) Performed: Procedure(s): APPENDECTOMY LAPAROSCOPIC (N/A)  Patient Location: ICU  Anesthesia Type:General  Level of Consciousness: awake, alert , oriented and patient cooperative  Airway and Oxygen Therapy: Patient Spontanous Breathing and Patient connected to nasal cannula oxygen  Post-op Pain: moderate  Post-op Assessment: Post-op Vital signs reviewed, Patient's Cardiovascular Status Stable, Respiratory Function Stable, Patent Airway, No signs of Nausea or vomiting, Adequate PO intake and Pain level controlled  Post-op Vital Signs: Reviewed and stable  Complications: No apparent anesthesia complications

## 2012-09-20 ENCOUNTER — Encounter (HOSPITAL_COMMUNITY): Payer: Self-pay | Admitting: General Surgery

## 2012-09-20 LAB — URINE CULTURE

## 2012-09-20 MED ORDER — SODIUM CHLORIDE 0.9 % IV BOLUS (SEPSIS)
500.0000 mL | Freq: Once | INTRAVENOUS | Status: AC
  Administered 2012-09-20: 500 mL via INTRAVENOUS

## 2012-09-21 LAB — CBC
MCH: 31.8 pg (ref 26.0–34.0)
MCV: 95.3 fL (ref 78.0–100.0)
Platelets: 206 10*3/uL (ref 150–400)
RBC: 4 MIL/uL (ref 3.87–5.11)
RDW: 12.8 % (ref 11.5–15.5)
WBC: 9.2 10*3/uL (ref 4.0–10.5)

## 2012-09-21 MED ORDER — BISACODYL 10 MG RE SUPP
10.0000 mg | Freq: Every day | RECTAL | Status: DC | PRN
  Administered 2012-09-21: 10 mg via RECTAL
  Filled 2012-09-21 (×2): qty 1

## 2012-09-21 MED ORDER — ACETAMINOPHEN 325 MG PO TABS
650.0000 mg | ORAL_TABLET | Freq: Four times a day (QID) | ORAL | Status: DC | PRN
  Administered 2012-09-21: 650 mg via ORAL
  Filled 2012-09-21: qty 2

## 2012-09-21 MED ORDER — BISACODYL 10 MG RE SUPP: 10.0000 mg | Freq: Every day | RECTAL | Status: DC | PRN

## 2012-09-21 MED ORDER — AMOXICILLIN-POT CLAVULANATE 875-125 MG PO TABS
1.0000 | ORAL_TABLET | Freq: Two times a day (BID) | ORAL | Status: DC
  Administered 2012-09-22: 1 via ORAL
  Filled 2012-09-21: qty 1

## 2012-09-21 NOTE — Progress Notes (Signed)
3 Days Post-Op  Subjective: Pain better.  No nausea.  Appetite still slow.  Some flatus.  No BM.  Objective: Vital signs in last 24 hours: Temp:  [98.4 F (36.9 C)-100.7 F (38.2 C)] 100.7 F (38.2 C) (03/12 2017) Pulse Rate:  [67-80] 80 (03/12 2017) Resp:  [8-22] 16 (03/12 2017) BP: (111-131)/(64-89) 125/69 mmHg (03/12 2017) SpO2:  [90 %-94 %] 92 % (03/12 2017) Weight:  [88.4 kg (194 lb 14.2 oz)] 88.4 kg (194 lb 14.2 oz) (03/12 0500) Last BM Date: 09/17/12  Intake/Output from previous day: 03/11 0701 - 03/12 0700 In: 3130 [P.O.:1280; I.V.:1800; IV Piggyback:50] Out: 2200 [Urine:2200] Intake/Output this shift:    General appearance: alert and no distress GI: +BS, soft, mild-mid distention.  Expected tenderness.  Inc c/d/i.  Lab Results:   Recent Labs  09/19/12 0440 09/21/12 0950  WBC 4.4 9.2  HGB 13.6 12.7  HCT 41.2 38.1  PLT 192 206   BMET  Recent Labs  09/19/12 0201 09/19/12 0440  NA 135 136  K 4.5 4.3  CL 97 99  CO2 28 27  GLUCOSE 177* 147*  BUN 21 21  CREATININE 1.16* 1.19*  CALCIUM 8.7 8.9   PT/INR No results found for this basename: LABPROT, INR,  in the last 72 hours ABG No results found for this basename: PHART, PCO2, PO2, HCO3,  in the last 72 hours  Studies/Results: No results found.  Anti-infectives: Anti-infectives   Start     Dose/Rate Route Frequency Ordered Stop   09/22/12 1000  amoxicillin-clavulanate (AUGMENTIN) 875-125 MG per tablet 1 tablet     1 tablet Oral Every 12 hours 09/21/12 2110     09/19/12 2000  ertapenem (INVANZ) 1 g in sodium chloride 0.9 % 50 mL IVPB     1 g 100 mL/hr over 30 Minutes Intravenous Every 24 hours 09/19/12 0201     09/18/12 2100  ertapenem (INVANZ) 1 g in sodium chloride 0.9 % 50 mL IVPB     1 g 100 mL/hr over 30 Minutes Intravenous  Once 09/18/12 2050 09/18/12 2200      Assessment/Plan: s/p Procedure(s): APPENDECTOMY LAPAROSCOPIC (N/A) Increase activity.  Will d/c tele.  Dulcosate suppository.   Diet as tolerated.  Will start oral abx.  LOS: 3 days    ZIEGLER,[name] 09/21/2012

## 2012-09-21 NOTE — Care Management Note (Signed)
    Page 1 of 1   09/21/2012     11:03:04 AM   CARE MANAGEMENT NOTE 09/21/2012  Patient:  Julie Cameron, Julie Cameron   Account Number:  [id number]  Date Initiated:  09/21/2012  Documentation initiated by:  Sharrie Rothman  Subjective/Objective Assessment:   Pt admitted from home with acute appendicitis. Pt is s/p lap appy. Pt lives with her husband and son and will return home at discharge. Pt is independent with ADL's.     Action/Plan:   No CM needs noted.   Anticipated DC Date:  09/22/2012   Anticipated DC Plan:  HOME/SELF CARE      DC Planning Services  CM consult      Choice offered to / List presented to:             Status of service:  Completed, signed off Medicare Important Message given?   (If response is "NO", the following Medicare IM given date fields will be blank) Date Medicare IM given:   Date Additional Medicare IM given:    Discharge Disposition:  HOME/SELF CARE  Per UR Regulation:    If discussed at Long Length of Stay Meetings, dates discussed:    Comments:  09/21/12 1100 Arlyss Queen, RN BSN CM

## 2012-09-21 NOTE — Progress Notes (Signed)
PT BEING TRANSFERRED TO ROOM 303. ALERT AJD ORIENTED, ABDOMINAL DRSGS X3 DRY AND INTACT W/O VISIBLE DRAINAGE.DENIED ANY PAIN TODAY. RT AC NSL  PATENT. VVS. LUNG SOUNDS ARE CLEAR. O2 SATS 95% ON ROOM AIR. TRANSFER REPORT CALLED TO KAREN RN ON 300.

## 2012-09-21 NOTE — Progress Notes (Signed)
UR Chart Review Completed  

## 2012-09-22 MED ORDER — AMOXICILLIN-POT CLAVULANATE 875-125 MG PO TABS: 1.0000 | ORAL_TABLET | Freq: Two times a day (BID) | ORAL | Status: DC

## 2012-09-22 MED ORDER — HYDROCODONE-ACETAMINOPHEN 5-325 MG PO TABS: 1.0000 | ORAL_TABLET | ORAL | Status: DC | PRN

## 2012-09-22 NOTE — Progress Notes (Signed)
Pt received discharge instructions along with prescritions and follow up appointments. Pt verbalized understanding of all instructions. Pt was escorted via wheelchair to vehicle. Pt discharge to home in stable condition.

## 2012-10-03 NOTE — Discharge Summary (Signed)
Physician Discharge Summary  Patient ID: Julie Cameron MRN: [id number] DOB/AGE: 1955-07-29 57 y.o.  Admit date: 09/18/2012 Discharge date: 09/22/2012  Admission Diagnoses:  Acute appendicitis  Discharge Diagnoses:  Ruptured appendicitis Active Problems:   * No active hospital problems. *   Discharged Condition: stable  Hospital Course: Patient presented with RLQ pain.  Work-up was consistent for acute appendicitis.  Patient admitted and taken to OR.  Ruptured appendicitis noted.  Patient continued on IV abx.  WBC improved and clinically continued to show progress.    Consults: None  Significant Diagnostic Studies: labs: daily  Treatments: IV hydration and surgery: Laparoscopic appendectomy  Discharge Exam: Blood pressure 121/75, pulse 65, temperature 98.4 F (36.9 C), temperature source Oral, resp. rate 16, height 5\' 3"  (1.6 m), weight 88.4 kg (194 lb 14.2 oz), SpO2 94.00%. General appearance: alert and no distress Resp: clear to auscultation bilaterally Cardio: regular rate and rhythm GI: +BS, soft, expected tenderness.  Incision clean and dry.  Disposition: 01-Home or Self Care  Discharge Orders   Future Orders Complete By Expires     Call MD for:  persistant nausea and vomiting  As directed     Call MD for:  redness, tenderness, or signs of infection (pain, swelling, redness, odor or green/yellow discharge around incision site)  As directed     Call MD for:  severe uncontrolled pain  As directed     Call MD for:  temperature >100.4  As directed     Diet - low sodium heart healthy  As directed     Discharge instructions  As directed     Comments:      Increase activity as tolerated. May place ice pack for comfort.  Alternate an anti-inflammatory such as ibuprofen (Motrin, Advil) 400-600mg  every 6 hours with the prescribed pain medication.   Do not take any additional acetaminophen as there is Tylenol in the pain medication.    Discharge wound care:  As directed     Comments:      Clean surgical sites with soap and water.  May shower the morning after surgery unless instructed by Dr. Leticia Penna otherwise.  No soaking for 2-3 weeks.    If adhesive strips are in place, they may be removed in 1-2 weeks while in the shower.    Driving Restrictions  As directed     Comments:      No driving while on pain medications.    Increase activity slowly  As directed     Lifting restrictions  As directed     Comments:      No lifting over 20lbs for 4-5 weeks post-op.        Medication List    TAKE these medications       amoxicillin-clavulanate 875-125 MG per tablet  Commonly known as:  AUGMENTIN  Take 1 tablet by mouth every 12 (twelve) hours.     aspirin EC 81 MG tablet  Take 81 mg by mouth every evening.     buPROPion 150 MG 24 hr tablet  Commonly known as:  WELLBUTRIN XL  Take 150 mg by mouth every morning.     escitalopram 10 MG tablet  Commonly known as:  LEXAPRO  Take 10 mg by mouth every morning.     Eszopiclone 3 MG Tabs  Take 3 mg by mouth at bedtime. Take immediately before bedtime     hydrochlorothiazide 25 MG tablet  Commonly known as:  HYDRODIURIL  Take 25 mg by mouth  every morning.     HYDROcodone-acetaminophen 5-325 MG per tablet  Commonly known as:  NORCO/VICODIN  Take 1-2 tablets by mouth every 4 (four) hours as needed.     lisinopril 10 MG tablet  Commonly known as:  PRINIVIL,ZESTRIL  Take 10 mg by mouth every morning.     multivitamin with minerals Tabs  Take 1 tablet by mouth at bedtime.     pantoprazole 40 MG tablet  Commonly known as:  PROTONIX  Take 40 mg by mouth every morning.     SYSTANE BALANCE 0.6 % Soln  Generic drug:  Propylene Glycol  Apply 1-2 drops to eye daily.           Follow-up Information   Follow up with Fabio Bering, MD In 1 week.   Contact information:   [address] [place] [address] [phone]       Signed: Fabio Bering 10/03/2012, 10:45 PM

## 2013-02-03 ENCOUNTER — Other Ambulatory Visit: Payer: Self-pay

## 2013-04-06 ENCOUNTER — Other Ambulatory Visit (HOSPITAL_COMMUNITY): Payer: Self-pay | Admitting: Obstetrics and Gynecology

## 2013-04-06 DIAGNOSIS — R1011 Right upper quadrant pain: Secondary | ICD-10-CM

## 2013-04-11 ENCOUNTER — Ambulatory Visit (HOSPITAL_COMMUNITY)
Admission: RE | Admit: 2013-04-11 | Discharge: 2013-04-11 | Disposition: A | Discharge status: Home / Self Care | Payer: BC Managed Care – PPO | Source: Ambulatory Visit | Attending: Obstetrics and Gynecology | Admitting: Obstetrics and Gynecology

## 2013-04-11 DIAGNOSIS — R1013 Epigastric pain: Secondary | ICD-10-CM | POA: Insufficient documentation

## 2013-04-11 DIAGNOSIS — R1011 Right upper quadrant pain: Secondary | ICD-10-CM | POA: Insufficient documentation

## 2013-04-11 DIAGNOSIS — K7689 Other specified diseases of liver: Secondary | ICD-10-CM | POA: Insufficient documentation

## 2013-08-31 ENCOUNTER — Other Ambulatory Visit: Payer: Self-pay | Admitting: Gastroenterology

## 2013-08-31 DIAGNOSIS — R1011 Right upper quadrant pain: Secondary | ICD-10-CM

## 2013-09-13 ENCOUNTER — Ambulatory Visit (HOSPITAL_COMMUNITY)
Admission: RE | Admit: 2013-09-13 | Discharge: 2013-09-13 | Disposition: A | Discharge status: Home / Self Care | Payer: BC Managed Care – PPO | Source: Ambulatory Visit | Attending: Gastroenterology | Admitting: Gastroenterology

## 2013-09-13 DIAGNOSIS — R1011 Right upper quadrant pain: Secondary | ICD-10-CM

## 2013-09-13 DIAGNOSIS — K7689 Other specified diseases of liver: Secondary | ICD-10-CM | POA: Insufficient documentation

## 2013-09-13 MED ORDER — TECHNETIUM TC 99M MEBROFENIN IV KIT
5.0000 | PACK | Freq: Once | INTRAVENOUS | Status: AC | PRN
  Administered 2013-09-13: 5 via INTRAVENOUS

## 2013-09-13 MED ORDER — SINCALIDE 5 MCG IJ SOLR: 0.0200 ug/kg | Freq: Once | INTRAMUSCULAR | Status: DC

## 2014-05-31 ENCOUNTER — Other Ambulatory Visit (HOSPITAL_COMMUNITY): Payer: Self-pay | Admitting: Internal Medicine

## 2014-05-31 DIAGNOSIS — R748 Abnormal levels of other serum enzymes: Secondary | ICD-10-CM

## 2014-06-05 ENCOUNTER — Ambulatory Visit (HOSPITAL_COMMUNITY)
Admission: RE | Admit: 2014-06-05 | Discharge: 2014-06-05 | Disposition: A | Discharge status: Home / Self Care | Payer: BC Managed Care – PPO | Source: Ambulatory Visit | Attending: Internal Medicine | Admitting: Internal Medicine

## 2014-06-05 ENCOUNTER — Other Ambulatory Visit: Payer: Self-pay | Admitting: Obstetrics and Gynecology

## 2014-06-05 DIAGNOSIS — R748 Abnormal levels of other serum enzymes: Secondary | ICD-10-CM | POA: Insufficient documentation

## 2014-06-08 LAB — CYTOLOGY - PAP

## 2014-08-06 ENCOUNTER — Other Ambulatory Visit: Payer: Self-pay | Admitting: Radiology

## 2014-08-06 DIAGNOSIS — I10 Essential (primary) hypertension: Secondary | ICD-10-CM

## 2014-08-07 ENCOUNTER — Ambulatory Visit (HOSPITAL_COMMUNITY): Payer: BLUE CROSS/BLUE SHIELD | Attending: Cardiology | Admitting: Cardiology

## 2014-08-07 ENCOUNTER — Encounter (HOSPITAL_COMMUNITY): Payer: Self-pay

## 2014-08-07 DIAGNOSIS — I10 Essential (primary) hypertension: Secondary | ICD-10-CM | POA: Diagnosis not present

## 2014-08-07 DIAGNOSIS — N183 Chronic kidney disease, stage 3 (moderate): Secondary | ICD-10-CM | POA: Diagnosis not present

## 2014-08-07 NOTE — Progress Notes (Signed)
Renal artery Duplex performed  

## 2015-11-12 ENCOUNTER — Ambulatory Visit (HOSPITAL_COMMUNITY)
Admission: RE | Admit: 2015-11-12 | Discharge: 2015-11-12 | Disposition: A | Discharge status: Home / Self Care | Payer: BLUE CROSS/BLUE SHIELD | Source: Ambulatory Visit | Attending: Pulmonary Disease | Admitting: Pulmonary Disease

## 2015-11-12 ENCOUNTER — Other Ambulatory Visit (HOSPITAL_COMMUNITY): Payer: Self-pay | Admitting: Pulmonary Disease

## 2015-11-12 DIAGNOSIS — J209 Acute bronchitis, unspecified: Secondary | ICD-10-CM | POA: Diagnosis not present

## 2016-01-10 ENCOUNTER — Emergency Department (HOSPITAL_COMMUNITY)
Admission: EM | Admit: 2016-01-10 | Discharge: 2016-01-10 | Disposition: A | Discharge status: Home / Self Care | Payer: BLUE CROSS/BLUE SHIELD | Attending: Emergency Medicine | Admitting: Emergency Medicine

## 2016-01-10 ENCOUNTER — Emergency Department (HOSPITAL_COMMUNITY): Payer: BLUE CROSS/BLUE SHIELD

## 2016-01-10 ENCOUNTER — Encounter (HOSPITAL_COMMUNITY): Payer: Self-pay | Admitting: Emergency Medicine

## 2016-01-10 DIAGNOSIS — I10 Essential (primary) hypertension: Secondary | ICD-10-CM | POA: Diagnosis not present

## 2016-01-10 DIAGNOSIS — F329 Major depressive disorder, single episode, unspecified: Secondary | ICD-10-CM | POA: Insufficient documentation

## 2016-01-10 DIAGNOSIS — Z79899 Other long term (current) drug therapy: Secondary | ICD-10-CM | POA: Diagnosis not present

## 2016-01-10 DIAGNOSIS — L03115 Cellulitis of right lower limb: Secondary | ICD-10-CM | POA: Insufficient documentation

## 2016-01-10 DIAGNOSIS — M7989 Other specified soft tissue disorders: Secondary | ICD-10-CM | POA: Diagnosis present

## 2016-01-10 DIAGNOSIS — L03116 Cellulitis of left lower limb: Secondary | ICD-10-CM | POA: Diagnosis not present

## 2016-01-10 HISTORY — DX: Disorder of kidney and ureter, unspecified: N28.9

## 2016-01-10 LAB — BASIC METABOLIC PANEL
ANION GAP: 8 (ref 5–15)
BUN: 24 mg/dL — AB (ref 6–20)
CO2: 29 mmol/L (ref 22–32)
Calcium: 9.8 mg/dL (ref 8.9–10.3)
Chloride: 101 mmol/L (ref 101–111)
Creatinine, Ser: 1.09 mg/dL — ABNORMAL HIGH (ref 0.44–1.00)
GFR, EST NON AFRICAN AMERICAN: 55 mL/min — AB (ref >60)
Glucose, Bld: 103 mg/dL — ABNORMAL HIGH (ref 65–99)
POTASSIUM: 3.6 mmol/L (ref 3.5–5.1)
Sodium: 138 mmol/L (ref 135–145)

## 2016-01-10 LAB — CBC WITH DIFFERENTIAL/PLATELET
Basophils Absolute: 0 10*3/uL (ref 0.0–0.1)
Basophils Relative: 0 %
Eosinophils Absolute: 0.2 10*3/uL (ref 0.0–0.7)
Eosinophils Relative: 2 %
HEMATOCRIT: 41.1 % (ref 36.0–46.0)
Hemoglobin: 13.5 g/dL (ref 12.0–15.0)
Lymphocytes Relative: 30 %
Lymphs Abs: 2.3 10*3/uL (ref 0.7–4.0)
MCH: 30.9 pg (ref 26.0–34.0)
MCHC: 32.8 g/dL (ref 30.0–36.0)
MCV: 94.1 fL (ref 78.0–100.0)
MONO ABS: 0.7 10*3/uL (ref 0.1–1.0)
Monocytes Relative: 9 %
NEUTROS PCT: 59 %
Neutro Abs: 4.5 10*3/uL (ref 1.7–7.7)
Platelets: 223 10*3/uL (ref 150–400)
RBC: 4.37 MIL/uL (ref 3.87–5.11)
RDW: 12.7 % (ref 11.5–15.5)
WBC: 7.6 10*3/uL (ref 4.0–10.5)

## 2016-01-10 LAB — LACTIC ACID, PLASMA: LACTIC ACID, VENOUS: 1.3 mmol/L (ref 0.5–1.9)

## 2016-01-10 MED ORDER — CEPHALEXIN 500 MG PO CAPS: 500.0000 mg | ORAL_CAPSULE | Freq: Four times a day (QID) | ORAL | Status: DC

## 2016-01-10 MED ORDER — SULFAMETHOXAZOLE-TRIMETHOPRIM 800-160 MG PO TABS: 1.0000 | ORAL_TABLET | Freq: Two times a day (BID) | ORAL | Status: DC

## 2016-01-10 NOTE — ED Notes (Signed)
MD at bedside. 

## 2016-01-10 NOTE — ED Notes (Signed)
Swelling to BLE for last 2 weeks.  Notice redness and burning to lower legs today.  Denies any pain.

## 2016-01-10 NOTE — Discharge Instructions (Signed)
Take the prescriptions as directed. Elevate your legs as much as possible. Call your regular medical doctor on Monday to schedule a follow up appointment within the next 3 days.  Return to the Emergency Department immediately sooner if worsening.

## 2016-01-10 NOTE — ED Provider Notes (Signed)
CSN: [id number]     Arrival date & time 01/10/16  1609 History   First MD Initiated Contact with Patient 01/10/16 1648     Chief Complaint  Patient presents with  . Leg Swelling    both      HPI  Pt was seen at 1705. Per pt, c/o gradual onset and worsening of persistent bilat LE's swelling for the past 2 weeks. Pt states she feels her LLE is "more swollen" than her RLE. Pt states erythema to bilat LE's (L>R) just started today. Denies leg/calf pain, no focal motor weakness, no tingling/numbness in extremities, no fevers, no back pain, no abd pain, no injury.    Past Medical History  Diagnosis Date  . Hypertension   . Anxiety   . Depression   . Acid reflux   . Renal disorder    Past Surgical History  Procedure Laterality Date  . Laparoscopic appendectomy N/A 09/18/2012    Procedure: APPENDECTOMY LAPAROSCOPIC;  Surgeon: Fabio BeringBrent C Ziegler, MD;  Location: [facility];  Service: General;  Laterality: N/A;    Social History  Substance Use Topics  . Smoking status: Never Smoker   . Smokeless tobacco: None  . Alcohol Use: No    Review of Systems ROS: Statement: All systems negative except as marked or noted in the HPI; Constitutional: Negative for fever and chills. ; ; Eyes: Negative for eye pain, redness and discharge. ; ; ENMT: Negative for ear pain, hoarseness, nasal congestion, sinus pressure and sore throat. ; ; Cardiovascular: Negative for chest pain, palpitations, diaphoresis, dyspnea and +peripheral edema. ; ; Respiratory: Negative for cough, wheezing and stridor. ; ; Gastrointestinal: Negative for nausea, vomiting, diarrhea, abdominal pain, blood in stool, hematemesis, jaundice and rectal bleeding. . ; ; Genitourinary: Negative for dysuria, flank pain and hematuria. ; ; Musculoskeletal: Negative for back pain and neck pain. Negative for swelling and trauma.; ; Skin: +rash. Negative for pruritus, abrasions, blisters, bruising and skin lesion.; ; Neuro: Negative for headache,  lightheadedness and neck stiffness. Negative for weakness, altered level of consciousness, altered mental status, extremity weakness, paresthesias, involuntary movement, seizure and syncope.     Allergies  Review of patient's allergies indicates no known allergies.  Home Medications   Prior to Admission medications   Medication Sig Start Date End Date Taking? Authorizing Provider  buPROPion (WELLBUTRIN XL) 150 MG 24 hr tablet Take 150 mg by mouth every morning.   Yes Historical Provider, MD  escitalopram (LEXAPRO) 10 MG tablet Take 10 mg by mouth every morning.   Yes Historical Provider, MD  Eszopiclone 3 MG TABS Take 3 mg by mouth at bedtime. Take immediately before bedtime   Yes Historical Provider, MD  famotidine (PEPCID) 20 MG tablet Take 20 mg by mouth daily.   Yes Historical Provider, MD  hydrochlorothiazide (HYDRODIURIL) 25 MG tablet Take 25 mg by mouth every morning.   Yes Historical Provider, MD  lisinopril (PRINIVIL,ZESTRIL) 10 MG tablet Take 10 mg by mouth every morning.   Yes Historical Provider, MD  Multiple Vitamin (MULTIVITAMIN WITH MINERALS) TABS Take 1 tablet by mouth at bedtime.   Yes Historical Provider, MD  Propylene Glycol (SYSTANE BALANCE) 0.6 % SOLN Apply 1-2 drops to eye daily.   Yes Historical Provider, MD  rosuvastatin (CRESTOR) 10 MG tablet Take 10 mg by mouth daily.   Yes Historical Provider, MD   BP 123/74 mmHg  Pulse 90  Temp(Src) 98.5 F (36.9 C) (Oral)  Resp 18  Ht 5\' 4"  (1.626 m)  Wt 185 lb (83.915 kg)  BMI 31.74 kg/m2  SpO2 95% Physical Exam  1710: Physical examination:  Nursing notes reviewed; Vital signs and O2 SAT reviewed;  Constitutional: Well developed, Well nourished, Well hydrated, In no acute distress; Head:  Normocephalic, atraumatic; Eyes: EOMI, PERRL, No scleral icterus; ENMT: Mouth and pharynx normal, Mucous membranes moist; Neck: Supple, Full range of motion, No lymphadenopathy; Cardiovascular: Regular rate and rhythm, No murmur, rub, or  gallop; Respiratory: Breath sounds clear & equal bilaterally, No rales, rhonchi, wheezes.  Speaking full sentences with ease, Normal respiratory effort/excursion; Chest: Nontender, Movement normal; Abdomen: Soft, Nontender, Nondistended, Normal bowel sounds; Genitourinary: No CVA tenderness; Extremities: Pulses normal, No deformity.  +tr pedal edema RLE, +1 pedal edema LLE with mild calf asymmetry. +lateral left lower leg with erythema, no open wounds. +very mild erythema to right lower leg.; Neuro: AA&Ox3, Major CN grossly intact.  Speech clear. No gross focal motor or sensory deficits in extremities.; Skin: Color normal, Warm, Dry.   ED Course  Procedures (including critical care time) Labs Review  Imaging Review  I have personally reviewed and evaluated these images and lab results as part of my medical decision-making.   EKG Interpretation None      MDM  MDM Reviewed: previous chart, nursing note and vitals Reviewed previous: labs Interpretation: labs and ultrasound     Results for orders placed or performed during the hospital encounter of 01/10/16  Basic metabolic panel  Result Value Ref Range   Sodium 138 135 - 145 mmol/L   Potassium 3.6 3.5 - 5.1 mmol/L   Chloride 101 101 - 111 mmol/L   CO2 29 22 - 32 mmol/L   Glucose, Bld 103 (H) 65 - 99 mg/dL   BUN 24 (H) 6 - 20 mg/dL   Creatinine, Ser 1.611.09 (H) 0.44 - 1.00 mg/dL   Calcium 9.8 8.9 - 09.610.3 mg/dL   GFR calc non Af Amer 55 (L) >60 mL/min   GFR calc Af Amer >60 >60 mL/min   Anion gap 8 5 - 15  Lactic acid, plasma  Result Value Ref Range   Lactic Acid, Venous 1.3 0.5 - 1.9 mmol/L  CBC with Differential  Result Value Ref Range   WBC 7.6 4.0 - 10.5 K/uL   RBC 4.37 3.87 - 5.11 MIL/uL   Hemoglobin 13.5 12.0 - 15.0 g/dL   HCT 04.541.1 40.936.0 - 81.146.0 %   MCV 94.1 78.0 - 100.0 fL   MCH 30.9 26.0 - 34.0 pg   MCHC 32.8 30.0 - 36.0 g/dL   RDW 91.412.7 78.211.5 - 95.615.5 %   Platelets 223 150 - 400 K/uL   Neutrophils Relative % 59 %    Neutro Abs 4.5 1.7 - 7.7 K/uL   Lymphocytes Relative 30 %   Lymphs Abs 2.3 0.7 - 4.0 K/uL   Monocytes Relative 9 %   Monocytes Absolute 0.7 0.1 - 1.0 K/uL   Eosinophils Relative 2 %   Eosinophils Absolute 0.2 0.0 - 0.7 K/uL   Basophils Relative 0 %   Basophils Absolute 0.0 0.0 - 0.1 K/uL   [place] Venous Img Lower Unilateral Left 01/10/2016  CLINICAL DATA:  Left lower extremity swelling, pain, and erythema for 2 weeks. EXAM: LEFT LOWER EXTREMITY VENOUS DOPPLER ULTRASOUND TECHNIQUE: Gray-scale sonography with graded compression, as well as color Doppler and duplex ultrasound were performed to evaluate the lower extremity deep venous systems from the level of the common femoral vein and including the common femoral, femoral, profunda femoral, popliteal and  calf veins including the posterior tibial, peroneal and gastrocnemius veins when visible. The superficial great saphenous vein was also interrogated. Spectral Doppler was utilized to evaluate flow at rest and with distal augmentation maneuvers in the common femoral, femoral and popliteal veins. COMPARISON:  None. FINDINGS: Contralateral Common Femoral Vein: Respiratory phasicity is normal and symmetric with the symptomatic side. No evidence of thrombus. Normal compressibility. Common Femoral Vein: No evidence of thrombus. Normal compressibility, respiratory phasicity and response to augmentation. Saphenofemoral Junction: No evidence of thrombus. Normal compressibility and flow on color Doppler imaging. Profunda Femoral Vein: No evidence of thrombus. Normal compressibility and flow on color Doppler imaging. Femoral Vein: No evidence of thrombus. Normal compressibility, respiratory phasicity and response to augmentation. Popliteal Vein: No evidence of thrombus. Normal compressibility, respiratory phasicity and response to augmentation. Calf Veins: No evidence of thrombus. Normal compressibility and flow on color Doppler imaging. Superficial Great Saphenous Vein:  No evidence of thrombus. Normal compressibility and flow on color Doppler imaging. Venous Reflux:  None. Other Findings: Soft tissue edema is seen in the lateral aspect of the left calf ; cellulitis cannot be excluded. No focal fluid collection visualized in this region. IMPRESSION: No evidence of deep venous thrombosis. Soft tissue edema in the lateral aspect of the left calf, which may be due to cellulitis. Electronically Signed   By: Myles Rosenthal M.D.   On: 01/10/2016 18:35    1930:  Workup reassuring. Tx for cellulitis, f/u PMD next week. Dx and testing d/w pt.  Questions answered.  Verb understanding, agreeable to d/c home with outpt f/u.   Samuel Jester, DO 01/12/16 1503

## 2017-01-26 IMAGING — US US EXTREM LOW VENOUS*L*
1 series · 13 of 24 positions shown · non-contrast
Comparison: None.

CLINICAL DATA: Left lower extremity swelling, pain, and erythema
for 2 weeks.



[Series 1: us extrem low venous*left* · 0.08mm/px · 13 of 49 slices shown]
[im 1/49]
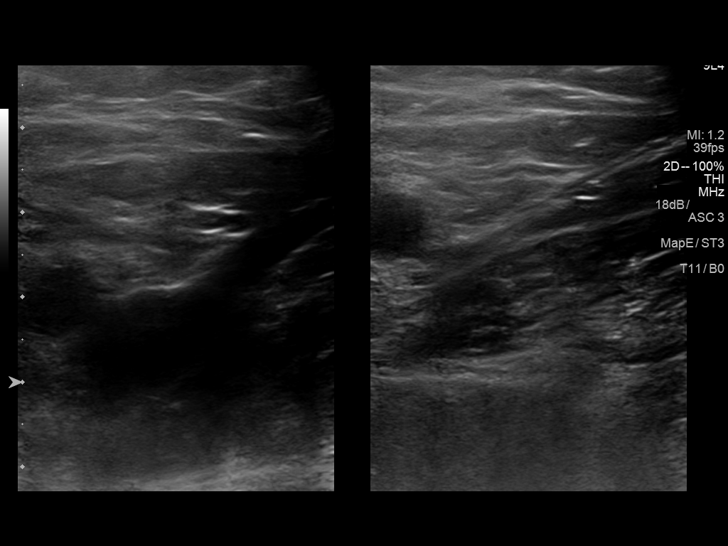
[im 5/49]
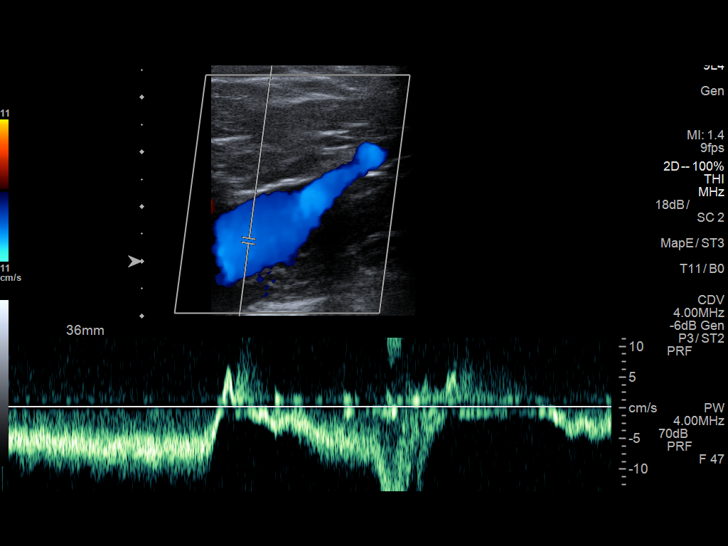
[im 9/49]
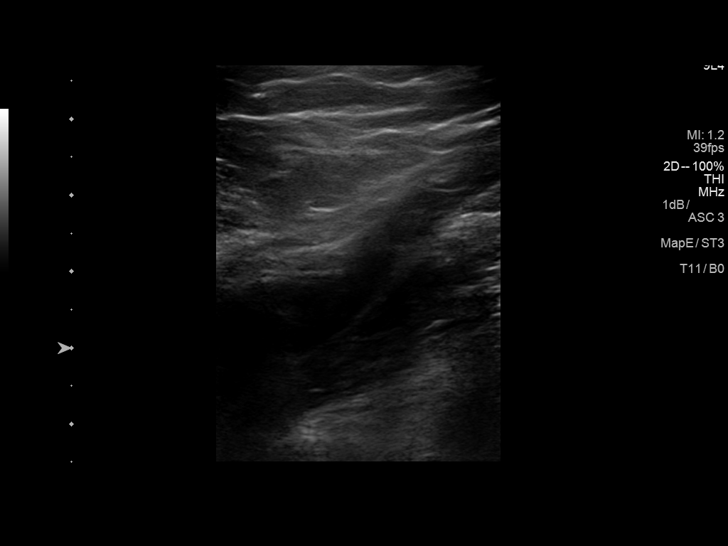
[im 13/49]
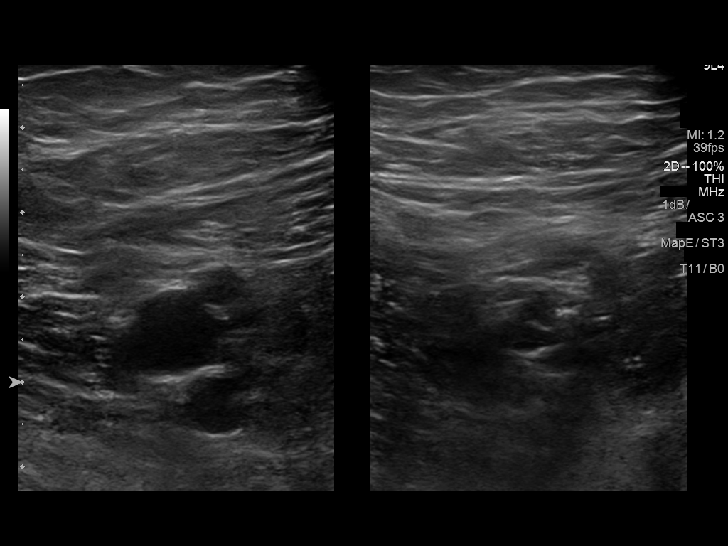
[im 17/49]
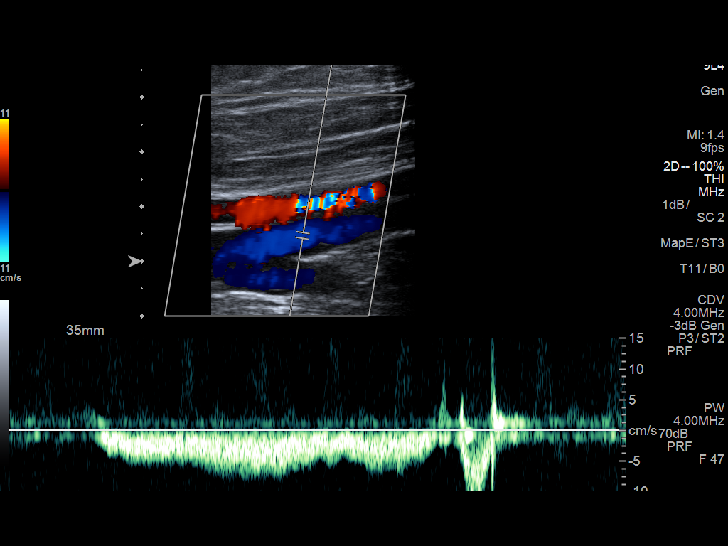
[im 21/49]
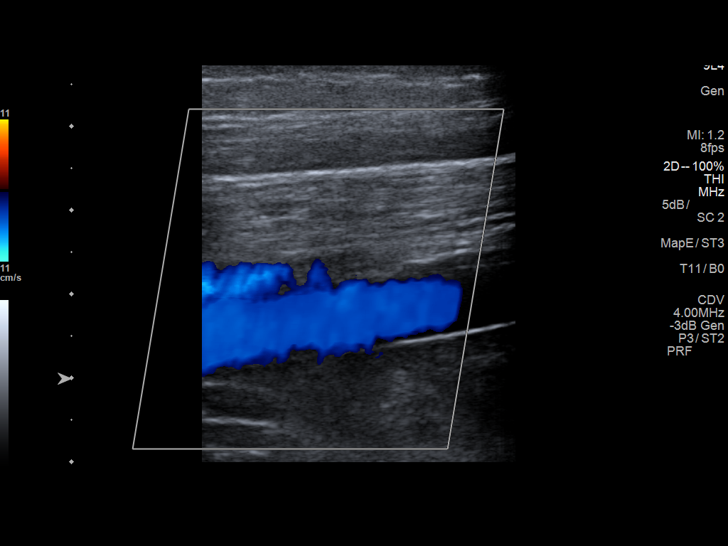
[im 26/49]
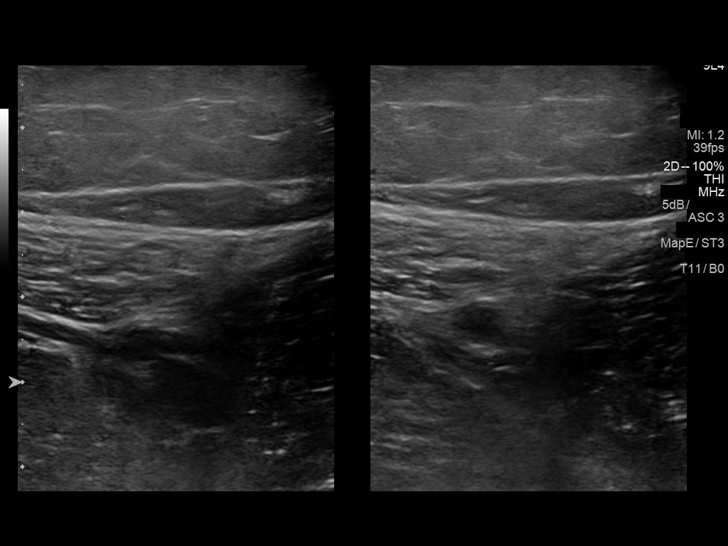
[im 28/49]
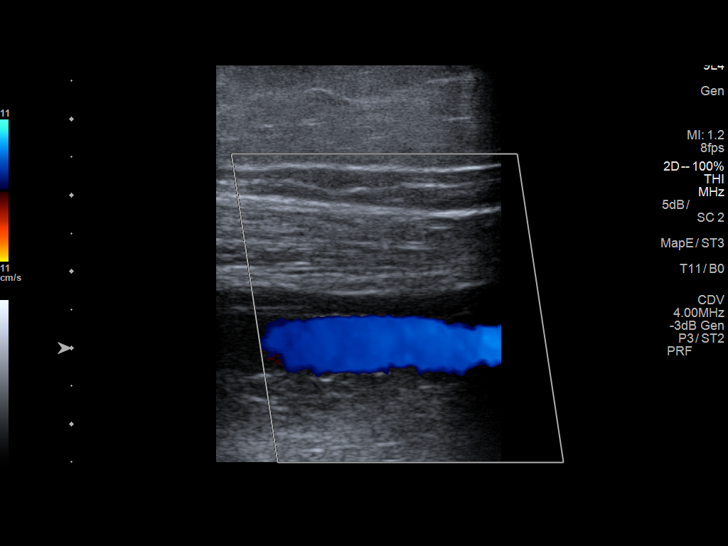
[im 32/49]
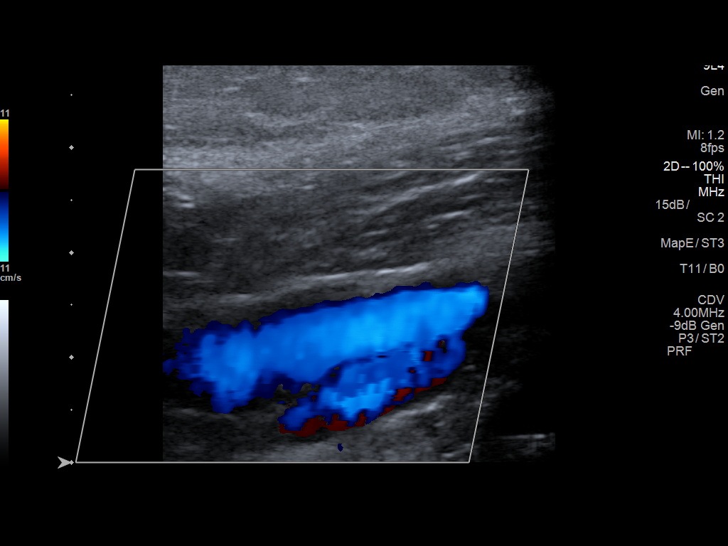
[im 36/49]
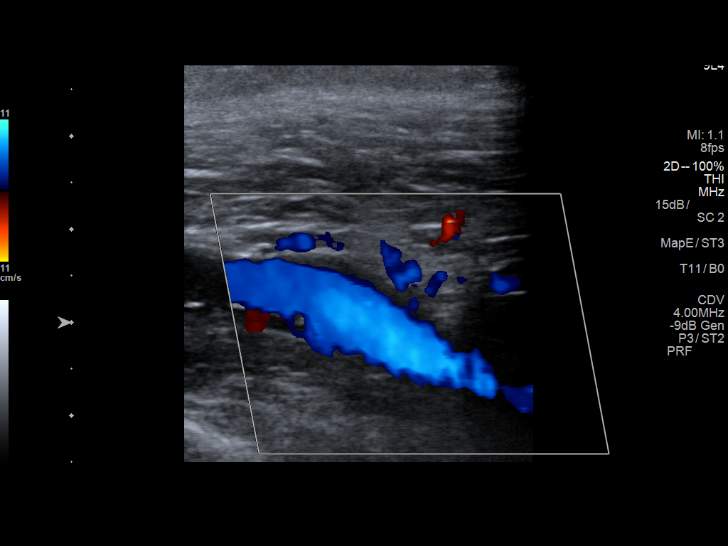
[im 40/49]
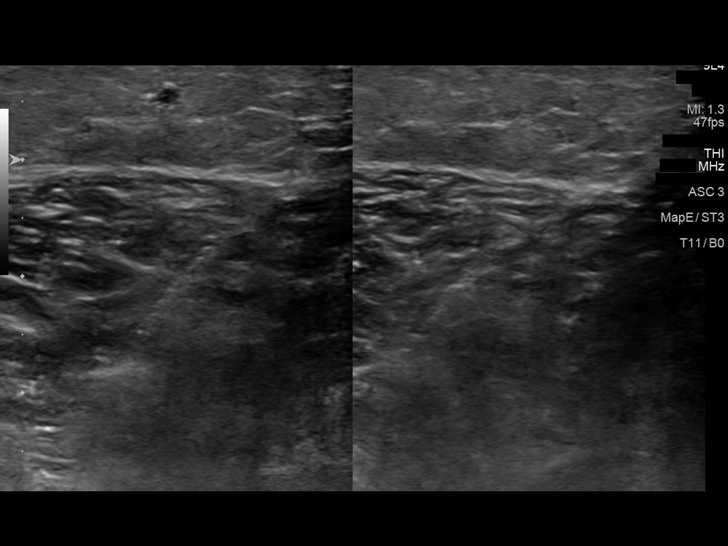
[im 44/49]
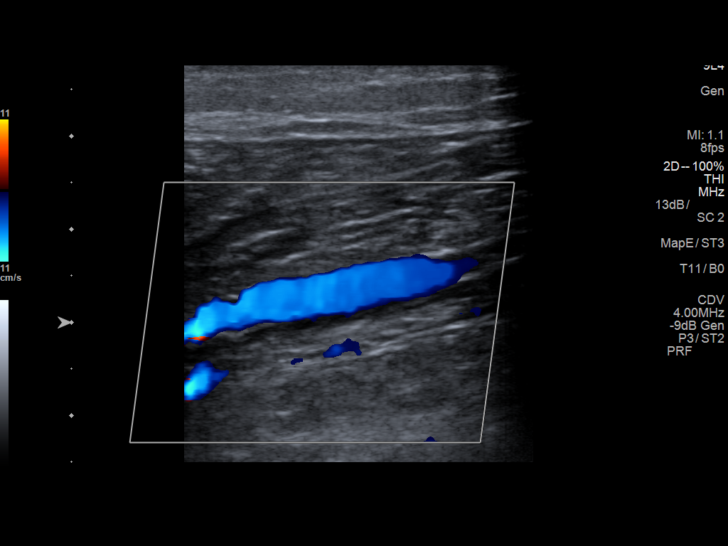
[im 49/49]
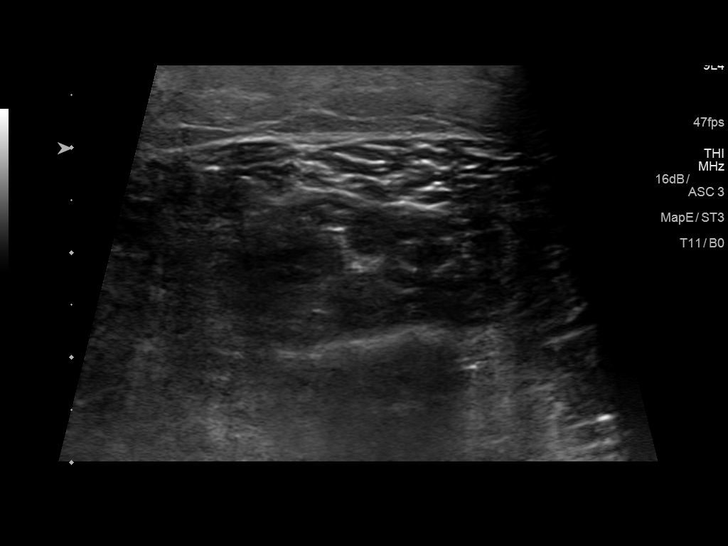

[13 of 24 positions shown; findings below may reference images not displayed]

FINDINGS: Contralateral Common Femoral Vein: Respiratory phasicity is normal
and symmetric with the symptomatic side. No evidence of thrombus.
Normal compressibility.

Common Femoral Vein: No evidence of thrombus. Normal
compressibility, respiratory phasicity and response to augmentation.

Saphenofemoral Junction: No evidence of thrombus. Normal
compressibility and flow on color Doppler imaging.

Profunda Femoral Vein: No evidence of thrombus. Normal
compressibility and flow on color Doppler imaging.

Femoral Vein: No evidence of thrombus. Normal compressibility,
respiratory phasicity and response to augmentation.

Popliteal Vein: No evidence of thrombus. Normal compressibility,
respiratory phasicity and response to augmentation.

Calf Veins: No evidence of thrombus. Normal compressibility and flow
on color Doppler imaging.

Superficial Great Saphenous Vein: No evidence of thrombus. Normal
compressibility and flow on color Doppler imaging.

Venous Reflux:  None.

Other Findings: Soft tissue edema is seen in the lateral aspect of
the left calf ; cellulitis cannot be excluded. No focal fluid
collection visualized in this region.
IMPRESSION: No evidence of deep venous thrombosis.

Soft tissue edema in the lateral aspect of the left calf, which may
be due to cellulitis.

## 2017-04-16 ENCOUNTER — Ambulatory Visit: Payer: Self-pay | Admitting: Orthopedic Surgery

## 2017-04-20 ENCOUNTER — Ambulatory Visit: Payer: Self-pay | Admitting: Orthopedic Surgery

## 2017-04-20 NOTE — Pre-Procedure Instructions (Signed)
Julie Cameron  04/20/2017      [facility] - [place], [place] - [address] [place] [address] Phone: [phone] Fax: [phone]    Your procedure is scheduled on Monday 04/26/2017.  Report to [facility] Admitting at 12:15 P.M.  Call this number if you have problems the morning of surgery:  [phone]   Remember:  Do not eat food or drink liquids after midnight.  Continue all other medications as directed by your physician except follow these medication instructions before surgery   Take these medicines the morning of surgery with A SIP OF WATER: Bupropion (Wellbutrin XL) Cyclosporine (Restasis) Dicyclomine (Bentyl) - if needed escitalopram (Lexapro) Tramadol (Ultram)  7 days prior to surgery STOP taking any Aspirin (unless otherwise instructed by your surgeon), Aleve, Naproxen, Ibuprofen, Motrin, Advil, Goody's, BC's, all herbal medications, fish oil, and all vitamins    Do not wear jewelry, make-up or nail polish.  Do not wear lotions, powders, or perfumes, or deodorant.  Do not shave 48 hours prior to surgery.    Do not bring valuables to the hospital.  [facility] is not responsible for any belongings or valuables.  Contacts, eyeglasses, dentures or bridgework may not be worn into surgery.  Leave your suitcase in the car.  After surgery it may be brought to your room.  For patients admitted to the hospital, discharge time will be determined by your treatment team.  Patients discharged the day of surgery will not be allowed to drive home.   Name and phone number of your driver:    Special instructions:   [facility]- Preparing For Surgery  Before surgery, you can play an important role. Because skin is not sterile, your skin needs to be as free of germs as possible. You can reduce the number of germs on your skin by washing with CHG (chlorahexidine gluconate) Soap before surgery.  CHG is an antiseptic cleaner which kills  germs and bonds with the skin to continue killing germs even after washing.  Please do not use if you have an allergy to CHG or antibacterial soaps. If your skin becomes reddened/irritated stop using the CHG.  Do not shave (including legs and underarms) for at least 48 hours prior to first CHG shower. It is OK to shave your face.  Please follow these instructions carefully.   1. Shower the NIGHT BEFORE SURGERY and the MORNING OF SURGERY with CHG.   2. If you chose to wash your hair, wash your hair first as usual with your normal shampoo.  3. After you shampoo, rinse your hair and body thoroughly to remove the shampoo.  4. Use CHG as you would any other liquid soap. You can apply CHG directly to the skin and wash gently with a scrungie or a clean washcloth.   5. Apply the CHG Soap to your body ONLY FROM THE NECK DOWN.  Do not use on open wounds or open sores. Avoid contact with your eyes, ears, mouth and genitals (private parts). Wash genitals (private parts) with your normal soap.  USE REGULAR SHAMPOO AND CONDITIONER FOR HAIR USE REGULAR SOAP FOR FACE AND PRIVATE AREA  6. Wash thoroughly, paying special attention to the area where your surgery will be performed.  7. Thoroughly rinse your body with warm water from the neck down.  8. DO NOT shower/wash with your normal soap after using and rinsing off the CHG Soap.  9. Pat yourself dry with  a CLEAN TOWEL and WASH CLOTH  10. Wear CLEAN PAJAMAS to bed the night before surgery, wear comfortable clothes the morning of surgery  11. Place CLEAN SHEETS on your bed the night of your first shower and DO NOT SLEEP WITH PETS.    Day of Surgery: Do not apply any deodorants/lotions. Please wear clean clothes to the hospital/surgery center.      Please read over the following fact sheets that you were given. Pain Booklet, Coughing and Deep Breathing, Total Joint Packet, MRSA Information and Surgical Site Infection Prevention

## 2017-04-20 NOTE — H&P (Signed)
TOTAL HIP ADMISSION H&P  Patient is admitted for left total hip arthroplasty.  Subjective:  Chief Complaint: left hip pain  HPI: Julie Cameron, 61 y.o. 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  Onset of symptoms was gradual starting 2 years ago with gradually worsening course since that time.The patient noted no past surgery on the left hip(s).  Patient currently rates pain in the left hip at 10 out of 10 with activity. Patient has night pain, worsening of pain with activity and weight bearing, trendelenberg gait, pain that interfers with activities of daily living and pain with passive range of motion. Patient has evidence of joint space narrowing and AVN with collapse by imaging studies. This condition presents safety issues increasing the risk of falls. This patient has had avascular necrosis of the hip.  There is no current active infection.  There are no active problems to display for this patient.  Past Medical History:  Diagnosis Date  . Acid reflux   . Anxiety   . Depression   . Hypertension   . Renal disorder     Past Surgical History:  Procedure Laterality Date  . LAPAROSCOPIC APPENDECTOMY N/A 09/18/2012   Procedure: APPENDECTOMY LAPAROSCOPIC;  Surgeon: [name] Ziegler, MD;  Location: [facility];  Service: General;  Laterality: N/A;     (Not in a hospital admission) No Known Allergies  Social History  Substance Use Topics  . Smoking status: Never Smoker  . Smokeless tobacco: Not on file  . Alcohol use No    No family history on file.   Review of Systems  Constitutional: Negative.   HENT: Negative.   Eyes: Negative.   Respiratory: Negative.   Cardiovascular: Negative.    Gastrointestinal: Positive for heartburn.  Genitourinary: Negative.   Musculoskeletal: Positive for joint pain.  Skin: Negative.   Neurological: Negative.   Endo/Heme/Allergies: Negative.   Psychiatric/Behavioral: Negative.     Objective:  Physical Exam  Vitals reviewed. Constitutional: She is oriented to person, place, and time. She appears well-developed and well-nourished.  HENT:  Head: Normocephalic.  Eyes: Pupils are equal, round, and reactive to light. Conjunctivae and EOM are normal.  Neck: Normal range of motion. Neck supple.  Cardiovascular: Normal rate, regular rhythm and intact distal pulses.   Respiratory: Effort normal. No respiratory distress.  GI: Soft. She exhibits no distension.  Genitourinary:  Genitourinary Comments: deferred  Musculoskeletal:       Left hip: She exhibits decreased range of motion and bony tenderness.  Neurological: She is alert and oriented to person, place, and time. She has normal reflexes.  Skin: Skin is warm and dry.  Psychiatric: She has a normal mood and affect. Her behavior is normal. Judgment and thought content normal.    Vital signs in last 24 hours: @VSRANGES@  Labs:   Estimated body mass index is 31.76 kg/m as calculated from the following:   Height as of 01/10/16: 5' 4" (1.626 m).   Weight as of 01/10/16: 83.9 kg (185 lb).   Imaging Review Plain radiographs demonstrate severe degenerative joint disease of the left hip(s). The bone quality appears to be adequate for age and reported activity level.  Assessment/Plan:  AVN, left hip(s)  The patient history, physical examination, clinical judgement of the provider   and imaging studies are consistent with end stage degenerative joint disease of the left hip(s) and total hip arthroplasty is deemed medically necessary. The treatment options including medical management, injection therapy, arthroscopy and arthroplasty were discussed at length. The risks and benefits of total  hip arthroplasty were presented and reviewed. The risks due to aseptic loosening, infection, stiffness, dislocation/subluxation,  thromboembolic complications and other imponderables were discussed.  The patient acknowledged the explanation, agreed to proceed with the plan and consent was signed. Patient is being admitted for inpatient treatment for surgery, pain control, PT, OT, prophylactic antibiotics, VTE prophylaxis, progressive ambulation and ADL's and discharge planning.The patient is planning to be discharged home with HEP

## 2017-04-21 ENCOUNTER — Encounter (HOSPITAL_COMMUNITY)
Admission: RE | Admit: 2017-04-21 | Discharge: 2017-04-21 | Disposition: A | Discharge status: Home / Self Care | Payer: Commercial Managed Care - PPO | Source: Ambulatory Visit | Attending: Orthopedic Surgery | Admitting: Orthopedic Surgery

## 2017-04-21 ENCOUNTER — Encounter (HOSPITAL_COMMUNITY): Payer: Self-pay

## 2017-04-21 DIAGNOSIS — Z01812 Encounter for preprocedural laboratory examination: Secondary | ICD-10-CM | POA: Insufficient documentation

## 2017-04-21 DIAGNOSIS — Z0183 Encounter for blood typing: Secondary | ICD-10-CM | POA: Insufficient documentation

## 2017-04-21 DIAGNOSIS — F419 Anxiety disorder, unspecified: Secondary | ICD-10-CM | POA: Diagnosis not present

## 2017-04-21 DIAGNOSIS — N289 Disorder of kidney and ureter, unspecified: Secondary | ICD-10-CM | POA: Insufficient documentation

## 2017-04-21 DIAGNOSIS — M879 Osteonecrosis, unspecified: Secondary | ICD-10-CM | POA: Diagnosis not present

## 2017-04-21 DIAGNOSIS — K219 Gastro-esophageal reflux disease without esophagitis: Secondary | ICD-10-CM | POA: Insufficient documentation

## 2017-04-21 DIAGNOSIS — I10 Essential (primary) hypertension: Secondary | ICD-10-CM | POA: Insufficient documentation

## 2017-04-21 DIAGNOSIS — R9431 Abnormal electrocardiogram [ECG] [EKG]: Secondary | ICD-10-CM | POA: Diagnosis not present

## 2017-04-21 DIAGNOSIS — F329 Major depressive disorder, single episode, unspecified: Secondary | ICD-10-CM | POA: Insufficient documentation

## 2017-04-21 DIAGNOSIS — Z01818 Encounter for other preprocedural examination: Secondary | ICD-10-CM | POA: Diagnosis present

## 2017-04-21 HISTORY — DX: Hyperlipidemia, unspecified: E78.5

## 2017-04-21 HISTORY — DX: Presence of spectacles and contact lenses: Z97.3

## 2017-04-21 HISTORY — DX: Cellulitis, unspecified: L03.90

## 2017-04-21 HISTORY — DX: Unspecified osteoarthritis, unspecified site: M19.90

## 2017-04-21 LAB — CBC
HEMATOCRIT: 41.2 % (ref 36.0–46.0)
HEMOGLOBIN: 13.5 g/dL (ref 12.0–15.0)
MCH: 30.8 pg (ref 26.0–34.0)
MCHC: 32.8 g/dL (ref 30.0–36.0)
MCV: 94.1 fL (ref 78.0–100.0)
Platelets: 249 10*3/uL (ref 150–400)
RBC: 4.38 MIL/uL (ref 3.87–5.11)
RDW: 12.6 % (ref 11.5–15.5)
WBC: 7.5 10*3/uL (ref 4.0–10.5)

## 2017-04-21 LAB — BASIC METABOLIC PANEL
Anion gap: 11 (ref 5–15)
BUN: 22 mg/dL — ABNORMAL HIGH (ref 6–20)
CALCIUM: 9.7 mg/dL (ref 8.9–10.3)
CO2: 25 mmol/L (ref 22–32)
Chloride: 100 mmol/L — ABNORMAL LOW (ref 101–111)
Creatinine, Ser: 1.02 mg/dL — ABNORMAL HIGH (ref 0.44–1.00)
GFR calc Af Amer: >60 mL/min (ref >60)
GFR, EST NON AFRICAN AMERICAN: 59 mL/min — AB (ref >60)
Glucose, Bld: 99 mg/dL (ref 65–99)
POTASSIUM: 4.2 mmol/L (ref 3.5–5.1)
Sodium: 136 mmol/L (ref 135–145)

## 2017-04-21 LAB — TYPE AND SCREEN
ABO/RH(D): O POS
Antibody Screen: NEGATIVE

## 2017-04-21 LAB — SURGICAL PCR SCREEN
MRSA, PCR: NEGATIVE
Staphylococcus aureus: NEGATIVE

## 2017-04-21 LAB — ABO/RH: ABO/RH(D): O POS

## 2017-04-21 NOTE — Progress Notes (Signed)
PCP: Dr. Lenon Curt she saw recently for "medical clearance" Cardiologist: Denies Nephrologist: Dr. Darrick Penna, reports saw last Nov 2017 was "released for a year" before next follow up  EKG: Today CXR: Denies ECHO: Denies Stress Test: Denies Cardiac Cath: Denies  Patient denies shortness of breath, fever, cough, and chest pain at PAT appointment.  Patient verbalized understanding of instructions provided today at the PAT appointment.  Patient asked to review instructions at home and day of surgery.

## 2017-04-23 MED ORDER — CEFAZOLIN SODIUM-DEXTROSE 2-4 GM/100ML-% IV SOLN
2.0000 g | INTRAVENOUS | Status: AC
  Administered 2017-04-26: 2 g via INTRAVENOUS
  Filled 2017-04-23: qty 100

## 2017-04-23 MED ORDER — ACETAMINOPHEN 10 MG/ML IV SOLN
1000.0000 mg | INTRAVENOUS | Status: AC
  Administered 2017-04-26: 1000 mg via INTRAVENOUS
  Filled 2017-04-23 (×2): qty 100

## 2017-04-23 MED ORDER — TRANEXAMIC ACID 1000 MG/10ML IV SOLN
1000.0000 mg | INTRAVENOUS | Status: AC
  Administered 2017-04-26: 1000 mg via INTRAVENOUS
  Filled 2017-04-23: qty 1100

## 2017-04-26 ENCOUNTER — Inpatient Hospital Stay (HOSPITAL_COMMUNITY): Payer: Commercial Managed Care - PPO | Admitting: Certified Registered"

## 2017-04-26 ENCOUNTER — Inpatient Hospital Stay (HOSPITAL_COMMUNITY): Payer: Commercial Managed Care - PPO

## 2017-04-26 ENCOUNTER — Encounter (HOSPITAL_COMMUNITY)
Admission: RE | Disposition: A | Discharge status: Home / Self Care | Payer: Self-pay | Source: Ambulatory Visit | Attending: Orthopedic Surgery

## 2017-04-26 ENCOUNTER — Inpatient Hospital Stay (HOSPITAL_COMMUNITY)
Admission: RE | Admit: 2017-04-26 | Discharge: 2017-04-27 | DRG: 470 | Disposition: A | Discharge status: Home / Self Care | Payer: Commercial Managed Care - PPO | Source: Ambulatory Visit | Attending: Orthopedic Surgery | Admitting: Orthopedic Surgery

## 2017-04-26 ENCOUNTER — Encounter (HOSPITAL_COMMUNITY): Payer: Self-pay

## 2017-04-26 DIAGNOSIS — M879 Osteonecrosis, unspecified: Secondary | ICD-10-CM | POA: Diagnosis present

## 2017-04-26 DIAGNOSIS — Z419 Encounter for procedure for purposes other than remedying health state, unspecified: Secondary | ICD-10-CM

## 2017-04-26 DIAGNOSIS — Z09 Encounter for follow-up examination after completed treatment for conditions other than malignant neoplasm: Secondary | ICD-10-CM

## 2017-04-26 DIAGNOSIS — M87052 Idiopathic aseptic necrosis of left femur: Secondary | ICD-10-CM | POA: Diagnosis present

## 2017-04-26 DIAGNOSIS — Z9049 Acquired absence of other specified parts of digestive tract: Secondary | ICD-10-CM | POA: Diagnosis not present

## 2017-04-26 DIAGNOSIS — M1612 Unilateral primary osteoarthritis, left hip: Secondary | ICD-10-CM | POA: Diagnosis present

## 2017-04-26 DIAGNOSIS — K219 Gastro-esophageal reflux disease without esophagitis: Secondary | ICD-10-CM | POA: Diagnosis present

## 2017-04-26 DIAGNOSIS — I10 Essential (primary) hypertension: Secondary | ICD-10-CM | POA: Diagnosis present

## 2017-04-26 DIAGNOSIS — Z9181 History of falling: Secondary | ICD-10-CM | POA: Diagnosis not present

## 2017-04-26 HISTORY — PX: TOTAL HIP ARTHROPLASTY: SHX124

## 2017-04-26 SURGERY — ARTHROPLASTY, HIP, TOTAL, ANTERIOR APPROACH
Anesthesia: Spinal | Site: Hip | Laterality: Left

## 2017-04-26 MED ORDER — SUGAMMADEX SODIUM 200 MG/2ML IV SOLN
INTRAVENOUS | Status: AC
  Filled 2017-04-26: qty 2

## 2017-04-26 MED ORDER — POLYETHYLENE GLYCOL 3350 17 G PO PACK
17.0000 g | PACK | Freq: Every day | ORAL | Status: DC | PRN
  Administered 2017-04-27: 17 g via ORAL
  Filled 2017-04-26: qty 1

## 2017-04-26 MED ORDER — CYCLOSPORINE 0.05 % OP EMUL: 1.0000 [drp] | Freq: Two times a day (BID) | OPHTHALMIC | Status: DC

## 2017-04-26 MED ORDER — LISINOPRIL 40 MG PO TABS
40.0000 mg | ORAL_TABLET | Freq: Every day | ORAL | Status: DC
  Administered 2017-04-27: 40 mg via ORAL
  Filled 2017-04-26: qty 1

## 2017-04-26 MED ORDER — ONDANSETRON HCL 4 MG/2ML IJ SOLN: 4.0000 mg | Freq: Once | INTRAMUSCULAR | Status: DC | PRN

## 2017-04-26 MED ORDER — ONDANSETRON HCL 4 MG/2ML IJ SOLN
INTRAMUSCULAR | Status: AC
  Filled 2017-04-26: qty 2

## 2017-04-26 MED ORDER — TRANEXAMIC ACID 1000 MG/10ML IV SOLN
1000.0000 mg | Freq: Once | INTRAVENOUS | Status: AC
  Administered 2017-04-26: 1000 mg via INTRAVENOUS
  Filled 2017-04-26: qty 10

## 2017-04-26 MED ORDER — FENTANYL CITRATE (PF) 250 MCG/5ML IJ SOLN
INTRAMUSCULAR | Status: AC
  Filled 2017-04-26: qty 5

## 2017-04-26 MED ORDER — DOCUSATE SODIUM 100 MG PO CAPS
100.0000 mg | ORAL_CAPSULE | Freq: Two times a day (BID) | ORAL | Status: DC
  Administered 2017-04-26 – 2017-04-27 (×2): 100 mg via ORAL
  Filled 2017-04-26 (×2): qty 1

## 2017-04-26 MED ORDER — KETOROLAC TROMETHAMINE 30 MG/ML IJ SOLN
INTRAMUSCULAR | Status: AC
  Administered 2017-04-26: 30 mg
  Filled 2017-04-26: qty 1

## 2017-04-26 MED ORDER — DIPHENHYDRAMINE HCL 12.5 MG/5ML PO ELIX: 12.5000 mg | ORAL_SOLUTION | ORAL | Status: DC | PRN

## 2017-04-26 MED ORDER — ASPIRIN 81 MG PO CHEW
81.0000 mg | CHEWABLE_TABLET | Freq: Two times a day (BID) | ORAL | Status: DC
  Administered 2017-04-27: 81 mg via ORAL
  Filled 2017-04-26: qty 1

## 2017-04-26 MED ORDER — MIDAZOLAM HCL 2 MG/2ML IJ SOLN
INTRAMUSCULAR | Status: DC | PRN
  Administered 2017-04-26: 2 mg via INTRAVENOUS

## 2017-04-26 MED ORDER — KETOROLAC TROMETHAMINE 30 MG/ML IJ SOLN
INTRAMUSCULAR | Status: AC
  Filled 2017-04-26: qty 1

## 2017-04-26 MED ORDER — MIDAZOLAM HCL 2 MG/2ML IJ SOLN
INTRAMUSCULAR | Status: AC
  Filled 2017-04-26: qty 2

## 2017-04-26 MED ORDER — ACETAMINOPHEN 325 MG PO TABS: 650.0000 mg | ORAL_TABLET | Freq: Four times a day (QID) | ORAL | Status: DC | PRN

## 2017-04-26 MED ORDER — PHENOL 1.4 % MT LIQD: 1.0000 | OROMUCOSAL | Status: DC | PRN

## 2017-04-26 MED ORDER — METOCLOPRAMIDE HCL 5 MG/ML IJ SOLN: 5.0000 mg | Freq: Three times a day (TID) | INTRAMUSCULAR | Status: DC | PRN

## 2017-04-26 MED ORDER — METHOCARBAMOL 1000 MG/10ML IJ SOLN
500.0000 mg | Freq: Four times a day (QID) | INTRAVENOUS | Status: DC | PRN
  Filled 2017-04-26: qty 5

## 2017-04-26 MED ORDER — GLYCOPYRROLATE 0.2 MG/ML IJ SOLN
INTRAMUSCULAR | Status: DC | PRN
  Administered 2017-04-26: 0.1 mg via INTRAVENOUS

## 2017-04-26 MED ORDER — SENNA 8.6 MG PO TABS
2.0000 | ORAL_TABLET | Freq: Every day | ORAL | Status: DC
  Administered 2017-04-26: 17.2 mg via ORAL
  Filled 2017-04-26: qty 2

## 2017-04-26 MED ORDER — 0.9 % SODIUM CHLORIDE (POUR BTL) OPTIME
TOPICAL | Status: DC | PRN
  Administered 2017-04-26: 1000 mL

## 2017-04-26 MED ORDER — DEXAMETHASONE SODIUM PHOSPHATE 10 MG/ML IJ SOLN
INTRAMUSCULAR | Status: AC
  Filled 2017-04-26: qty 1

## 2017-04-26 MED ORDER — ROCURONIUM BROMIDE 50 MG/5ML IV SOLN
INTRAVENOUS | Status: AC
  Filled 2017-04-26: qty 1

## 2017-04-26 MED ORDER — FENTANYL CITRATE (PF) 100 MCG/2ML IJ SOLN
INTRAMUSCULAR | Status: AC
  Administered 2017-04-26: 50 ug via INTRAVENOUS
  Filled 2017-04-26: qty 2

## 2017-04-26 MED ORDER — LACTATED RINGERS IV SOLN
INTRAVENOUS | Status: DC
  Administered 2017-04-26: 13:00:00 via INTRAVENOUS

## 2017-04-26 MED ORDER — OXYCODONE HCL 5 MG PO TABS: 5.0000 mg | ORAL_TABLET | Freq: Once | ORAL | Status: DC | PRN

## 2017-04-26 MED ORDER — CEFAZOLIN SODIUM-DEXTROSE 2-4 GM/100ML-% IV SOLN
INTRAVENOUS | Status: AC
  Filled 2017-04-26: qty 100

## 2017-04-26 MED ORDER — MENTHOL 3 MG MT LOZG: 1.0000 | LOZENGE | OROMUCOSAL | Status: DC | PRN

## 2017-04-26 MED ORDER — PROPOFOL 10 MG/ML IV BOLUS
INTRAVENOUS | Status: AC
  Filled 2017-04-26: qty 20

## 2017-04-26 MED ORDER — HYDROCHLOROTHIAZIDE 25 MG PO TABS
25.0000 mg | ORAL_TABLET | Freq: Every morning | ORAL | Status: DC
  Administered 2017-04-27: 25 mg via ORAL
  Filled 2017-04-26: qty 1

## 2017-04-26 MED ORDER — CEFAZOLIN SODIUM-DEXTROSE 2-4 GM/100ML-% IV SOLN
2.0000 g | Freq: Four times a day (QID) | INTRAVENOUS | Status: AC
  Administered 2017-04-26 – 2017-04-27 (×2): 2 g via INTRAVENOUS
  Filled 2017-04-26 (×2): qty 100

## 2017-04-26 MED ORDER — KETOROLAC TROMETHAMINE 30 MG/ML IJ SOLN
INTRAMUSCULAR | Status: DC | PRN
  Administered 2017-04-26: 30 mg via INTRAMUSCULAR
  Administered 2017-04-26: 30 mg

## 2017-04-26 MED ORDER — PHENYLEPHRINE 40 MCG/ML (10ML) SYRINGE FOR IV PUSH (FOR BLOOD PRESSURE SUPPORT)
PREFILLED_SYRINGE | INTRAVENOUS | Status: AC
  Filled 2017-04-26: qty 10

## 2017-04-26 MED ORDER — SODIUM CHLORIDE 0.9 % IJ SOLN
INTRAMUSCULAR | Status: DC | PRN
  Administered 2017-04-26: 30 mL

## 2017-04-26 MED ORDER — ESCITALOPRAM OXALATE 10 MG PO TABS
20.0000 mg | ORAL_TABLET | Freq: Every day | ORAL | Status: DC
  Administered 2017-04-27: 20 mg via ORAL
  Filled 2017-04-26: qty 2

## 2017-04-26 MED ORDER — ZOLPIDEM TARTRATE 5 MG PO TABS
5.0000 mg | ORAL_TABLET | Freq: Every evening | ORAL | Status: DC | PRN
  Administered 2017-04-26: 5 mg via ORAL
  Filled 2017-04-26: qty 1

## 2017-04-26 MED ORDER — LIDOCAINE 2% (20 MG/ML) 5 ML SYRINGE
INTRAMUSCULAR | Status: AC
  Filled 2017-04-26: qty 5

## 2017-04-26 MED ORDER — SODIUM CHLORIDE 0.9 % IV SOLN
INTRAVENOUS | Status: AC | PRN
  Administered 2017-04-26: 1000 mL

## 2017-04-26 MED ORDER — DEXAMETHASONE SODIUM PHOSPHATE 10 MG/ML IJ SOLN
10.0000 mg | Freq: Once | INTRAMUSCULAR | Status: AC
  Administered 2017-04-27: 10 mg via INTRAVENOUS
  Filled 2017-04-26: qty 1

## 2017-04-26 MED ORDER — ONDANSETRON HCL 4 MG/2ML IJ SOLN: 4.0000 mg | Freq: Four times a day (QID) | INTRAMUSCULAR | Status: DC | PRN

## 2017-04-26 MED ORDER — SODIUM CHLORIDE 0.9 % IV SOLN
INTRAVENOUS | Status: DC
  Administered 2017-04-26: 22:00:00 via INTRAVENOUS

## 2017-04-26 MED ORDER — ONDANSETRON HCL 4 MG/2ML IJ SOLN
INTRAMUSCULAR | Status: DC | PRN
  Administered 2017-04-26: 4 mg via INTRAVENOUS

## 2017-04-26 MED ORDER — POVIDONE-IODINE 10 % EX SWAB: 2.0000 "application " | Freq: Once | CUTANEOUS | Status: DC

## 2017-04-26 MED ORDER — METHOCARBAMOL 500 MG PO TABS
500.0000 mg | ORAL_TABLET | Freq: Four times a day (QID) | ORAL | Status: DC | PRN
  Administered 2017-04-26: 500 mg via ORAL
  Filled 2017-04-26: qty 1

## 2017-04-26 MED ORDER — SODIUM CHLORIDE 0.9 % IV SOLN: INTRAVENOUS | Status: DC

## 2017-04-26 MED ORDER — HYDROMORPHONE HCL 1 MG/ML IJ SOLN
0.5000 mg | INTRAMUSCULAR | Status: DC | PRN
  Administered 2017-04-26 – 2017-04-27 (×2): 1 mg via INTRAVENOUS
  Filled 2017-04-26 (×2): qty 1

## 2017-04-26 MED ORDER — BUPIVACAINE IN DEXTROSE 0.75-8.25 % IT SOLN
INTRATHECAL | Status: DC | PRN
  Administered 2017-04-26: 15 mg via INTRATHECAL

## 2017-04-26 MED ORDER — OXYCODONE HCL 5 MG/5ML PO SOLN: 5.0000 mg | Freq: Once | ORAL | Status: DC | PRN

## 2017-04-26 MED ORDER — ACETAMINOPHEN 650 MG RE SUPP: 650.0000 mg | Freq: Four times a day (QID) | RECTAL | Status: DC | PRN

## 2017-04-26 MED ORDER — PHENYLEPHRINE HCL 10 MG/ML IJ SOLN
INTRAVENOUS | Status: DC | PRN
  Administered 2017-04-26: 20 ug/min via INTRAVENOUS

## 2017-04-26 MED ORDER — LACTATED RINGERS IV SOLN
INTRAVENOUS | Status: DC | PRN
  Administered 2017-04-26 (×2): via INTRAVENOUS

## 2017-04-26 MED ORDER — PROPOFOL 500 MG/50ML IV EMUL
INTRAVENOUS | Status: DC | PRN
  Administered 2017-04-26: 100 ug/kg/min via INTRAVENOUS

## 2017-04-26 MED ORDER — HYDROCODONE-ACETAMINOPHEN 5-325 MG PO TABS
1.0000 | ORAL_TABLET | ORAL | Status: DC | PRN
  Administered 2017-04-27 (×3): 2 via ORAL
  Filled 2017-04-26 (×3): qty 2

## 2017-04-26 MED ORDER — DICYCLOMINE HCL 10 MG PO CAPS: 10.0000 mg | ORAL_CAPSULE | Freq: Four times a day (QID) | ORAL | Status: DC | PRN

## 2017-04-26 MED ORDER — CHLORHEXIDINE GLUCONATE 4 % EX LIQD: 60.0000 mL | Freq: Once | CUTANEOUS | Status: DC

## 2017-04-26 MED ORDER — ROSUVASTATIN CALCIUM 10 MG PO TABS
10.0000 mg | ORAL_TABLET | Freq: Every day | ORAL | Status: DC
  Administered 2017-04-26 – 2017-04-27 (×2): 10 mg via ORAL
  Filled 2017-04-26 (×2): qty 1

## 2017-04-26 MED ORDER — METOCLOPRAMIDE HCL 5 MG PO TABS: 5.0000 mg | ORAL_TABLET | Freq: Three times a day (TID) | ORAL | Status: DC | PRN

## 2017-04-26 MED ORDER — SODIUM CHLORIDE 0.9 % IR SOLN
Status: DC | PRN
  Administered 2017-04-26: 3000 mL

## 2017-04-26 MED ORDER — BUPIVACAINE-EPINEPHRINE (PF) 0.5% -1:200000 IJ SOLN
INTRAMUSCULAR | Status: AC
  Filled 2017-04-26: qty 30

## 2017-04-26 MED ORDER — FENTANYL CITRATE (PF) 100 MCG/2ML IJ SOLN
INTRAMUSCULAR | Status: DC | PRN
  Administered 2017-04-26: 50 ug via INTRAVENOUS
  Administered 2017-04-26: 25 ug via INTRAVENOUS

## 2017-04-26 MED ORDER — FENTANYL CITRATE (PF) 100 MCG/2ML IJ SOLN
25.0000 ug | INTRAMUSCULAR | Status: DC | PRN
  Administered 2017-04-26: 50 ug via INTRAVENOUS

## 2017-04-26 MED ORDER — BUPIVACAINE-EPINEPHRINE (PF) 0.5% -1:200000 IJ SOLN
INTRAMUSCULAR | Status: DC | PRN
  Administered 2017-04-26: 30 mL

## 2017-04-26 MED ORDER — ONDANSETRON HCL 4 MG PO TABS: 4.0000 mg | ORAL_TABLET | Freq: Four times a day (QID) | ORAL | Status: DC | PRN

## 2017-04-26 MED ORDER — BUPROPION HCL ER (XL) 150 MG PO TB24
300.0000 mg | ORAL_TABLET | Freq: Every day | ORAL | Status: DC
  Administered 2017-04-27: 300 mg via ORAL
  Filled 2017-04-26: qty 2

## 2017-04-26 SURGICAL SUPPLY — 53 items
ADH SKN CLS APL DERMABOND .7 (GAUZE/BANDAGES/DRESSINGS) ×1
ALCOHOL ISOPROPYL (RUBBING) (MISCELLANEOUS) ×3 IMPLANT
BLADE CLIPPER SURG (BLADE) ×2 IMPLANT
CAPT HIP TOTAL 2 ×2 IMPLANT
CHLORAPREP W/TINT 26ML (MISCELLANEOUS) ×3 IMPLANT
COVER SURGICAL LIGHT HANDLE (MISCELLANEOUS) ×3 IMPLANT
DERMABOND ADVANCED (GAUZE/BANDAGES/DRESSINGS) ×2
DERMABOND ADVANCED .7 DNX12 (GAUZE/BANDAGES/DRESSINGS) ×2 IMPLANT
DRAPE C-ARM 42X72 X-RAY (DRAPES) ×3 IMPLANT
DRAPE STERI IOBAN 125X83 (DRAPES) ×3 IMPLANT
DRAPE U-SHAPE 47X51 STRL (DRAPES) ×9 IMPLANT
DRSG AQUACEL AG ADV 3.5X10 (GAUZE/BANDAGES/DRESSINGS) ×3 IMPLANT
ELECT BLADE 4.0 EZ CLEAN MEGAD (MISCELLANEOUS) ×3
ELECT REM PT RETURN 9FT ADLT (ELECTROSURGICAL) ×3
ELECTRODE BLDE 4.0 EZ CLN MEGD (MISCELLANEOUS) ×1 IMPLANT
ELECTRODE REM PT RTRN 9FT ADLT (ELECTROSURGICAL) ×1 IMPLANT
EVACUATOR 1/8 PVC DRAIN (DRAIN) IMPLANT
GLOVE BIO SURGEON STRL SZ8.5 (GLOVE) ×6 IMPLANT
GLOVE BIOGEL PI IND STRL 8.5 (GLOVE) ×1 IMPLANT
GLOVE BIOGEL PI INDICATOR 8.5 (GLOVE) ×2
GOWN STRL REUS W/ TWL LRG LVL3 (GOWN DISPOSABLE) ×2 IMPLANT
GOWN STRL REUS W/TWL 2XL LVL3 (GOWN DISPOSABLE) ×5 IMPLANT
GOWN STRL REUS W/TWL LRG LVL3 (GOWN DISPOSABLE) ×6
HANDPIECE INTERPULSE COAX TIP (DISPOSABLE) ×3
HOOD PEEL AWAY FACE SHEILD DIS (HOOD) ×6 IMPLANT
KIT BASIN OR (CUSTOM PROCEDURE TRAY) ×3 IMPLANT
KIT ROOM TURNOVER OR (KITS) ×3 IMPLANT
MANIFOLD NEPTUNE II (INSTRUMENTS) ×3 IMPLANT
MARKER SKIN DUAL TIP RULER LAB (MISCELLANEOUS) ×6 IMPLANT
NDL SPNL 18GX3.5 QUINCKE PK (NEEDLE) ×1 IMPLANT
NEEDLE SPNL 18GX3.5 QUINCKE PK (NEEDLE) ×3 IMPLANT
NS IRRIG 1000ML POUR BTL (IV SOLUTION) ×3 IMPLANT
PACK TOTAL JOINT (CUSTOM PROCEDURE TRAY) ×3 IMPLANT
PACK UNIVERSAL I (CUSTOM PROCEDURE TRAY) ×3 IMPLANT
PAD ARMBOARD 7.5X6 YLW CONV (MISCELLANEOUS) ×6 IMPLANT
SAW OSC TIP CART 19.5X105X1.3 (SAW) ×3 IMPLANT
SEALER BIPOLAR AQUA 6.0 (INSTRUMENTS) IMPLANT
SET HNDPC FAN SPRY TIP SCT (DISPOSABLE) ×1 IMPLANT
SOL PREP POV-IOD 4OZ 10% (MISCELLANEOUS) ×3 IMPLANT
SUT ETHIBOND NAB CT1 #1 30IN (SUTURE) ×6 IMPLANT
SUT MNCRL AB 3-0 PS2 18 (SUTURE) ×3 IMPLANT
SUT MON AB 2-0 CT1 36 (SUTURE) ×3 IMPLANT
SUT VIC AB 1 CT1 27 (SUTURE) ×3
SUT VIC AB 1 CT1 27XBRD ANBCTR (SUTURE) ×1 IMPLANT
SUT VIC AB 2-0 CT1 27 (SUTURE) ×3
SUT VIC AB 2-0 CT1 TAPERPNT 27 (SUTURE) ×1 IMPLANT
SUT VLOC 180 0 24IN GS25 (SUTURE) ×3 IMPLANT
SYR 50ML LL SCALE MARK (SYRINGE) ×3 IMPLANT
TOWEL OR 17X24 6PK STRL BLUE (TOWEL DISPOSABLE) ×3 IMPLANT
TOWEL OR 17X26 10 PK STRL BLUE (TOWEL DISPOSABLE) ×3 IMPLANT
TRAY CATH 16FR W/PLASTIC CATH (SET/KITS/TRAYS/PACK) ×2 IMPLANT
TRAY FOLEY CATH SILVER 16FR (SET/KITS/TRAYS/PACK) IMPLANT
WATER STERILE IRR 1000ML POUR (IV SOLUTION) ×9 IMPLANT

## 2017-04-26 NOTE — Discharge Instructions (Signed)
°Dr. Kitzia Camus °Joint Replacement Specialist °[facility] °[address] °, [place] [address] °[phone] ° ° °TOTAL HIP REPLACEMENT POSTOPERATIVE DIRECTIONS ° ° ° °Hip Rehabilitation, Guidelines Following Surgery  ° °WEIGHT BEARING °Weight bearing as tolerated with assist device (walker, cane, etc) as directed, use it as long as suggested by your surgeon or therapist, typically at least 4-6 weeks. ° °The results of a hip operation are greatly improved after range of motion and muscle strengthening exercises. Follow all safety measures which are given to protect your hip. If any of these exercises cause increased pain or swelling in your joint, decrease the amount until you are comfortable again. Then slowly increase the exercises. Call your caregiver if you have problems or questions.  ° °HOME CARE INSTRUCTIONS  °Most of the following instructions are designed to prevent the dislocation of your new hip.  °Remove items at home which could result in a fall. This includes throw rugs or furniture in walking pathways.  °Continue medications as instructed at time of discharge. °· You may have some home medications which will be placed on hold until you complete the course of blood thinner medication. °· You may start showering once you are discharged home. Do not remove your dressing. °Do not put on socks or shoes without following the instructions of your caregivers.   °Sit on chairs with arms. Use the chair arms to help push yourself up when arising.  °Arrange for the use of a toilet seat elevator so you are not sitting low.  °· Walk with walker as instructed.  °You may resume a sexual relationship in one month or when given the OK by your caregiver.  °Use walker as long as suggested by your caregivers.  °You may put full weight on your legs and walk as much as is comfortable. °Avoid periods of inactivity such as sitting longer than an hour when not asleep. This helps prevent  blood clots.  °You may return to work once you are cleared by your surgeon.  °Do not drive a car for 6 weeks or until released by your surgeon.  °Do not drive while taking narcotics.  °Wear elastic stockings for two weeks following surgery during the day but you may remove then at night.  °Make sure you keep all of your appointments after your operation with all of your doctors and caregivers. You should call the office at the above phone number and make an appointment for approximately two weeks after the date of your surgery. °Please pick up a stool softener and laxative for home use as long as you are requiring pain medications. °· ICE to the affected hip every three hours for 30 minutes at a time and then as needed for pain and swelling. Continue to use ice on the hip for pain and swelling from surgery. You may notice swelling that will progress down to the foot and ankle.  This is normal after surgery.  Elevate the leg when you are not up walking on it.   °It is important for you to complete the blood thinner medication as prescribed by your doctor. °· Continue to use the breathing machine which will help keep your temperature down.  It is common for your temperature to cycle up and down following surgery, especially at night when you are not up moving around and exerting yourself.  The breathing machine keeps your lungs expanded and your temperature down. ° °RANGE OF MOTION AND STRENGTHENING EXERCISES  °These exercises are   designed to help you keep full movement of your hip joint. Follow your caregiver's or physical therapist's instructions. Perform all exercises about fifteen times, three times per day or as directed. Exercise both hips, even if you have had only one joint replacement. These exercises can be done on a training (exercise) mat, on the floor, on a table or on a bed. Use whatever works the best and is most comfortable for you. Use music or television while you are exercising so that the exercises  are a pleasant break in your day. This will make your life better with the exercises acting as a break in routine you can look forward to.  °Lying on your back, slowly slide your foot toward your buttocks, raising your knee up off the floor. Then slowly slide your foot back down until your leg is straight again.  °Lying on your back spread your legs as far apart as you can without causing discomfort.  °Lying on your side, raise your upper leg and foot straight up from the floor as far as is comfortable. Slowly lower the leg and repeat.  °Lying on your back, tighten up the muscle in the front of your thigh (quadriceps muscles). You can do this by keeping your leg straight and trying to raise your heel off the floor. This helps strengthen the largest muscle supporting your knee.  °Lying on your back, tighten up the muscles of your buttocks both with the legs straight and with the knee bent at a comfortable angle while keeping your heel on the floor.  ° °SKILLED REHAB INSTRUCTIONS: °If the patient is transferred to a skilled rehab facility following release from the hospital, a list of the current medications will be sent to the facility for the patient to continue.  When discharged from the skilled rehab facility, please have the facility set up the patient's Home Health Physical Therapy prior to being released. Also, the skilled facility will be responsible for providing the patient with their medications at time of release from the facility to include their pain medication and their blood thinner medication. If the patient is still at the rehab facility at time of the two week follow up appointment, the skilled rehab facility will also need to assist the patient in arranging follow up appointment in our office and any transportation needs. ° °MAKE SURE YOU:  °Understand these instructions.  °Will watch your condition.  °Will get help right away if you are not doing well or get worse. ° °Pick up stool softner and  laxative for home use following surgery while on pain medications. °Do not remove your dressing. °The dressing is waterproof--it is OK to take showers. °Continue to use ice for pain and swelling after surgery. °Do not use any lotions or creams on the incision until instructed by your surgeon. °Total Hip Protocol. ° ° °

## 2017-04-26 NOTE — Op Note (Signed)
OPERATIVE REPORT  SURGEON: Samson Frederic, MD   ASSISTANT: Hart Carwin, RNFA.  PREOPERATIVE DIAGNOSIS: Left hip avascular necrosis.   POSTOPERATIVE DIAGNOSIS: Left hip avascular necrosis.    PROCEDURE: Left total hip arthroplasty, anterior approach.   IMPLANTS: Biomet Taperloc Complete Microplasty stem, size 14 x 113, hi offset. Biomet G7 Cup, size 50 mm. Biomet E1 liner, size 32 mm, D, neutral. Biomet Biolox ceramic head ball, size 32 + 0 mm.  ANESTHESIA:  Spinal  ESTIMATED BLOOD LOSS: 300 mL.  ANTIBIOTICS: 2 g Ancef.  DRAINS: None.  COMPLICATIONS: None.   CONDITION: PACU - hemodynamically stable.   BRIEF CLINICAL NOTE: Julie Cameron is a 61 y.o. female with a long-standing history of Left hip avascular necrosis with collapse. After failing conservative management, the patient was indicated for total hip arthroplasty. The risks, benefits, and alternatives to the procedure were explained, and the patient elected to proceed.  PROCEDURE IN DETAIL: Surgical site was marked by myself in the pre-op holding area. Once inside the operating room, spinal anesthesia was obtained, and a foley catheter was inserted. The patient was then positioned on the Hana table. All bony prominences were well padded. The hip was prepped and draped in the normal sterile surgical fashion. A time-out was called verifying side and site of surgery. The patient received IV antibiotics within 60 minutes of beginning the procedure.  The direct anterior approach to the hip was performed through the Hueter interval. Lateral femoral circumflex vessels were treated with the Auqumantys. The anterior capsule was exposed and an inverted T capsulotomy was made.The femoral neck cut was made to the level of the templated cut. A corkscrew was placed into the head and the head was removed. The femoral head was found to have eburnated bone and delaminated cartilage. The head was passed to the back table and was  measured.  Acetabular exposure was achieved, and the pulvinar and labrum were excised. Sequential reaming of the acetabulum was then performed up to a size 49 mm reamer. A 50 mm cup was then opened and impacted into place at approximately 40 degrees of abduction and 20 degrees of anteversion. The final polyethylene liner was impacted into place and acetabular osteophytes were removed.   I then gained femoral exposure taking care to protect the abductors and greater trochanter. This was performed using standard external rotation, extension, and adduction. The capsule was peeled off the inner aspect of the greater trochanter, taking care to preserve the short external rotators. A cookie cutter was used to enter the femoral canal, and then the femoral canal finder was placed. Sequential broaching was performed up to a size 14. Calcar planer was used on the femoral neck remnant. I placed a hi offset neck and a trial head ball. The hip was reduced. Leg lengths and offset were checked fluoroscopically. The hip was dislocated and trial components were removed. The final implants were placed, and the hip was reduced.  Fluoroscopy was used to confirm component position and leg lengths. At 90 degrees of external rotation and full extension, the hip was stable to an anterior directed force.  The wound was copiously irrigated with normal saline using pulse lavage. Marcaine solution was injected into the periarticular soft tissue. The wound was closed in layers using #1 Vicryl and V-Loc for the fascia, 2-0 Vicryl for the subcutaneous fat, 2-0 Monocryl for the deep dermal layer, 3-0 running Monocryl subcuticular stitch, and Dermabond for the skin. Once the glue was fully dried, an Aquacell Ag dressing  was applied. The patient was transported to the recovery room in stable condition. Sponge, needle, and instrument counts were correct at the end of the case x2. The patient tolerated the procedure well and  there were no known complications.

## 2017-04-26 NOTE — Anesthesia Procedure Notes (Signed)
Procedure Name: MAC Date/Time: 04/26/2017 4:12 PM Performed by: Garrison Columbus T Pre-anesthesia Checklist: Patient identified, Emergency Drugs available, Suction available and Patient being monitored Patient Re-evaluated:Patient Re-evaluated prior to induction Oxygen Delivery Method: Simple face mask Preoxygenation: Pre-oxygenation with 100% oxygen Induction Type: IV induction Placement Confirmation: positive ETCO2 and breath sounds checked- equal and bilateral Dental Injury: Teeth and Oropharynx as per pre-operative assessment

## 2017-04-26 NOTE — Transfer of Care (Signed)
Immediate Anesthesia Transfer of Care Note  Patient: Julie Cameron  Procedure(s) Performed: LEFT TOTAL HIP ARTHROPLASTY ANTERIOR APPROACH (Left Hip)  Patient Location: PACU  Anesthesia Type:Spinal  Level of Consciousness: awake, alert  and oriented  Airway & Oxygen Therapy: Patient Spontanous Breathing  Post-op Assessment: Report given to RN and Post -op Vital signs reviewed and stable  Post vital signs: Reviewed and stable  Last Vitals:  Vitals:   04/26/17 1254  BP: (!) 141/89  Pulse: 67  Resp: 18  Temp: 36.9 C  SpO2: 96%    Last Pain:  Vitals:   04/26/17 1254  TempSrc: Oral  PainSc:          Complications: No apparent anesthesia complications

## 2017-04-26 NOTE — H&P (View-Only) (Signed)
TOTAL HIP ADMISSION H&P  Patient is admitted for left total hip arthroplasty.  Subjective:  Chief Complaint: left hip pain  HPI: Julie Cameron, 61 y.o. 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  Onset of symptoms was gradual starting 2 years ago with gradually worsening course since that time.The patient noted no past surgery on the left hip(s).  Patient currently rates pain in the left hip at 10 out of 10 with activity. Patient has night pain, worsening of pain with activity and weight bearing, trendelenberg gait, pain that interfers with activities of daily living and pain with passive range of motion. Patient has evidence of joint space narrowing and AVN with collapse by imaging studies. This condition presents safety issues increasing the risk of falls. This patient has had avascular necrosis of the hip.  There is no current active infection.  There are no active problems to display for this patient.  Past Medical History:  Diagnosis Date  . Acid reflux   . Anxiety   . Depression   . Hypertension   . Renal disorder     Past Surgical History:  Procedure Laterality Date  . LAPAROSCOPIC APPENDECTOMY N/A 09/18/2012   Procedure: APPENDECTOMY LAPAROSCOPIC;  Surgeon: Fabio Bering, MD;  Location: [facility];  Service: General;  Laterality: N/A;     (Not in a hospital admission) No Known Allergies  Social History  Substance Use Topics  . Smoking status: Never Smoker  . Smokeless tobacco: Not on file  . Alcohol use No    No family history on file.   Review of Systems  Constitutional: Negative.   HENT: Negative.   Eyes: Negative.   Respiratory: Negative.   Cardiovascular: Negative.    Gastrointestinal: Positive for heartburn.  Genitourinary: Negative.   Musculoskeletal: Positive for joint pain.  Skin: Negative.   Neurological: Negative.   Endo/Heme/Allergies: Negative.   Psychiatric/Behavioral: Negative.     Objective:  Physical Exam  Vitals reviewed. Constitutional: She is oriented to person, place, and time. She appears well-developed and well-nourished.  HENT:  Head: Normocephalic.  Eyes: Pupils are equal, round, and reactive to light. Conjunctivae and EOM are normal.  Neck: Normal range of motion. Neck supple.  Cardiovascular: Normal rate, regular rhythm and intact distal pulses.   Respiratory: Effort normal. No respiratory distress.  GI: Soft. She exhibits no distension.  Genitourinary:  Genitourinary Comments: deferred  Musculoskeletal:       Left hip: She exhibits decreased range of motion and bony tenderness.  Neurological: She is alert and oriented to person, place, and time. She has normal reflexes.  Skin: Skin is warm and dry.  Psychiatric: She has a normal mood and affect. Her behavior is normal. Judgment and thought content normal.    Vital signs in last 24 hours: @  Labs:   Estimated body mass index is 31.76 kg/m as calculated from the following:   Height as of 01/10/16:  (1.626 m).   Weight as of 01/10/16: 83.9 kg (185 lb).   Imaging Review Plain radiographs demonstrate severe degenerative joint disease of the left hip(s). The bone quality appears to be adequate for age and reported activity level.  Assessment/Plan:  AVN, left hip(s)  The patient history, physical examination, clinical judgement of the provider  and imaging studies are consistent with end stage degenerative joint disease of the left hip(s) and total hip arthroplasty is deemed medically necessary. The treatment options including medical management, injection therapy, arthroscopy and arthroplasty were discussed at length. The risks and benefits of total  hip arthroplasty were presented and reviewed. The risks due to aseptic loosening, infection, stiffness, dislocation/subluxation,  thromboembolic complications and other imponderables were discussed.  The patient acknowledged the explanation, agreed to proceed with the plan and consent was signed. Patient is being admitted for inpatient treatment for surgery, pain control, PT, OT, prophylactic antibiotics, VTE prophylaxis, progressive ambulation and ADL's and discharge planning.The patient is planning to be discharged home with HEP

## 2017-04-26 NOTE — Anesthesia Procedure Notes (Signed)
Spinal  Patient location during procedure: OR Start time: 04/26/2017 4:12 PM End time: 04/26/2017 4:22 PM Staffing Anesthesiologist: Heather Roberts Performed: anesthesiologist  Preanesthetic Checklist Completed: patient identified, surgical consent, pre-op evaluation, timeout performed, IV checked, risks and benefits discussed and monitors and equipment checked Spinal Block Patient position: sitting Prep: DuraPrep Patient monitoring: cardiac monitor, continuous pulse ox and blood pressure Approach: midline Location: L2-3 Injection technique: single-shot Needle Needle type: Pencan  Needle gauge: 24 G Needle length: 9 cm Additional Notes Functioning IV was confirmed and monitors were applied. Sterile prep and drape, including hand hygiene and sterile gloves were used. The patient was positioned and the spine was prepped. The skin was anesthetized with lidocaine.  Free flow of clear CSF was obtained prior to injecting local anesthetic into the CSF.  The spinal needle aspirated freely following injection.  The needle was carefully withdrawn.  The patient tolerated the procedure well.

## 2017-04-26 NOTE — Interval H&P Note (Signed)
History and Physical Interval Note:  04/26/2017 12:29 PM  Julie Cameron  has presented today for surgery, with the diagnosis of Avascular necrosis left hip  The various methods of treatment have been discussed with the patient and family. After consideration of risks, benefits and other options for treatment, the patient has consented to  Procedure(s) with comments: LEFT TOTAL HIP ARTHROPLASTY ANTERIOR APPROACH (Left) - Needs RNFA as a surgical intervention .  The patient's history has been reviewed, patient examined, no change in status, stable for surgery.  I have reviewed the patient's chart and labs.  Questions were answered to the patient's satisfaction.     Baylei Siebels, Cloyde Reams

## 2017-04-26 NOTE — Anesthesia Preprocedure Evaluation (Addendum)
Anesthesia Evaluation  Patient identified by MRN, date of birth, ID band Patient awake    Reviewed: Allergy & Precautions, NPO status , Patient's Chart, lab work & pertinent test results  Airway Mallampati: II  TM Distance: >3 FB Neck ROM: Full    Dental  (+) Teeth Intact, Dental Advisory Given   Pulmonary    breath sounds clear to auscultation       Cardiovascular hypertension,  Rhythm:Regular Rate:Normal     Neuro/Psych    GI/Hepatic   Endo/Other    Renal/GU      Musculoskeletal   Abdominal   Peds  Hematology   Anesthesia Other Findings   Reproductive/Obstetrics                            Anesthesia Physical Anesthesia Plan  ASA: III  Anesthesia Plan: Spinal   Post-op Pain Management:    Induction: Intravenous  PONV Risk Score and Plan: Ondansetron and Dexamethasone  Airway Management Planned: Natural Airway and Simple Face Mask  Additional Equipment:   Intra-op Plan:   Post-operative Plan: Extubation in OR  Informed Consent: I have reviewed the patients History and Physical, chart, labs and discussed the procedure including the risks, benefits and alternatives for the proposed anesthesia with the patient or authorized representative who has indicated his/her understanding and acceptance.   Dental advisory given  Plan Discussed with: CRNA and Anesthesiologist  Anesthesia Plan Comments:         Anesthesia Quick Evaluation

## 2017-04-27 ENCOUNTER — Encounter (HOSPITAL_COMMUNITY): Payer: Self-pay | Admitting: Orthopedic Surgery

## 2017-04-27 LAB — CBC
HEMATOCRIT: 33.7 % — AB (ref 36.0–46.0)
HEMOGLOBIN: 10.8 g/dL — AB (ref 12.0–15.0)
MCH: 30.6 pg (ref 26.0–34.0)
MCHC: 32 g/dL (ref 30.0–36.0)
MCV: 95.5 fL (ref 78.0–100.0)
Platelets: 183 10*3/uL (ref 150–400)
RBC: 3.53 MIL/uL — ABNORMAL LOW (ref 3.87–5.11)
RDW: 12.8 % (ref 11.5–15.5)
WBC: 8.1 10*3/uL (ref 4.0–10.5)

## 2017-04-27 LAB — BASIC METABOLIC PANEL
Anion gap: 9 (ref 5–15)
Anion gap: 8 (ref 5–15)
BUN: 17 mg/dL (ref 6–20)
BUN: 19 mg/dL (ref 6–20)
CALCIUM: 8.5 mg/dL — AB (ref 8.9–10.3)
CHLORIDE: 102 mmol/L (ref 101–111)
CHLORIDE: 102 mmol/L (ref 101–111)
CO2: 25 mmol/L (ref 22–32)
CO2: 24 mmol/L (ref 22–32)
CREATININE: 1.21 mg/dL — AB (ref 0.44–1.00)
CREATININE: 1.23 mg/dL — AB (ref 0.44–1.00)
Calcium: 8.3 mg/dL — ABNORMAL LOW (ref 8.9–10.3)
GFR calc Af Amer: 56 mL/min — ABNORMAL LOW (ref >60)
GFR calc Af Amer: 55 mL/min — ABNORMAL LOW (ref >60)
GFR calc non Af Amer: 48 mL/min — ABNORMAL LOW (ref >60)
GFR calc non Af Amer: 47 mL/min — ABNORMAL LOW (ref >60)
GLUCOSE: 109 mg/dL — AB (ref 65–99)
GLUCOSE: 207 mg/dL — AB (ref 65–99)
Potassium: 3.6 mmol/L (ref 3.5–5.1)
Potassium: 3.8 mmol/L (ref 3.5–5.1)
Sodium: 136 mmol/L (ref 135–145)
Sodium: 134 mmol/L — ABNORMAL LOW (ref 135–145)

## 2017-04-27 MED ORDER — DOCUSATE SODIUM 100 MG PO CAPS: 100.0000 mg | ORAL_CAPSULE | Freq: Two times a day (BID) | ORAL | 1 refills | Status: AC

## 2017-04-27 MED ORDER — SENNA 8.6 MG PO TABS: 2.0000 | ORAL_TABLET | Freq: Every day | ORAL | 0 refills | Status: AC

## 2017-04-27 MED ORDER — ASPIRIN 81 MG PO CHEW: 81.0000 mg | CHEWABLE_TABLET | Freq: Two times a day (BID) | ORAL | 1 refills | Status: DC

## 2017-04-27 MED ORDER — HYDROCODONE-ACETAMINOPHEN 5-325 MG PO TABS: 1.0000 | ORAL_TABLET | Freq: Four times a day (QID) | ORAL | 0 refills | Status: DC | PRN

## 2017-04-27 MED ORDER — ONDANSETRON HCL 4 MG PO TABS: 4.0000 mg | ORAL_TABLET | Freq: Four times a day (QID) | ORAL | 0 refills | Status: DC | PRN

## 2017-04-27 NOTE — Progress Notes (Signed)
Patient ready for discharge to home. All d/c instructions and rx reviewed with pt. Pt c/o no discomfort or exhibits no distress at this time. Alert and oriented, currently in recliner chair. Family to provide transportation home. All personal belongings with pt

## 2017-04-27 NOTE — Progress Notes (Signed)
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  PMH positive for anxiety, GERD, HTN, renal disorder.    PT Comments    Patient able to demonstrate safe technique with SW and with stairs this session.  Education on HEP completed as well as plan for shower transfer and car transfers.  Agree with d/c home this pm with family support and follow up HHPT.    Follow Up Recommendations  Home health PT     Equipment Recommendations  None recommended by PT    Recommendations for Other Services       Precautions / Restrictions Precautions Precautions: Fall Restrictions LLE Weight Bearing: Weight bearing as tolerated    Mobility  Bed Mobility Overal bed mobility: Needs Assistance Bed Mobility: Supine to Sit     Supine to sit: HOB elevated;Min assist     General bed mobility comments: up in chair  Transfers Overall transfer level: Needs assistance Equipment used: Standard walker Transfers: Sit to/from Stand Sit to Stand: Supervision Stand pivot transfers: Min assist       General transfer comment: cues for technique  Ambulation/Gait Ambulation/Gait assistance: Supervision Ambulation Distance (Feet): 70 Feet Assistive device: Standard walker Gait Pattern/deviations: Step-to pattern;Decreased stride length     General Gait Details: slow pace, but safe with SW   Stairs Stairs: Yes   Stair Management: One rail Left;Step to pattern;Sideways Number of Stairs: 2 General stair comments: demonstrated technique with cane and rail and sideways with one rail, pt preference with one rail as performed previous to surgery.  Wheelchair Mobility    Modified Rankin (Stroke Patients Only)       Balance Overall balance assessment: Needs assistance   Sitting balance-Leahy Scale: Good        Standing balance-Leahy Scale: Fair                              Cognition Arousal/Alertness: Awake/alert Behavior During Therapy: WFL for tasks assessed/performed Overall Cognitive Status: Within Functional Limits for tasks assessed                                        Exercises Total Joint Exercises Ankle Circles/Pumps: AROM;Both;10 reps;Supine Quad Sets: AROM;Both;10 reps;Supine Heel Slides: AAROM;Left;5 reps;Supine Hip ABduction/ADduction: Left;10 reps;AAROM;Supine    General Comments General comments (skin integrity, edema, etc.): issued HEP and reviewed with pt      Pertinent Vitals/Pain Pain Assessment: Faces Pain Score: 4  Faces Pain Scale: Hurts little more Pain Location: front of L hip Pain Descriptors / Indicators: Tender;Aching Pain Intervention(s): Monitored during session;Repositioned    Home Living Family/patient expects to be discharged to:: Private residence Living Arrangements: Spouse/significant other (and son) Available Help at Discharge: Family Type of Home: House Home Access: Stairs to enter Entrance Stairs-Rails: Left;Right Home Layout: One level Home Equipment: Shower seat - built in;Walker - standard;Cane - single point;Bedside commode      Prior Function Level of Independence: Independent      Comments: Environmental health practitioner    PT Goals (current goals can now be found in the care plan section) Acute Rehab PT Goals Patient Stated Goal: To go home PT Goal Formulation: With patient Time For Goal Achievement:  04/29/17 Potential to Achieve Goals: Good Progress towards PT goals: Progressing toward goals    Frequency    7X/week      PT Plan Current plan remains appropriate    Co-evaluation              AM-PAC PT "6 Clicks" Daily Activity  Outcome Measure  Difficulty turning over in bed (including adjusting bedclothes, sheets and blankets)?: A Little Difficulty moving from lying on back to  sitting on the side of the bed? : A Little Difficulty sitting down on and standing up from a chair with arms (e.g., wheelchair, bedside commode, etc,.)?: A Little Help needed moving to and from a bed to chair (including a wheelchair)?: A Little Help needed walking in hospital room?: A Little Help needed climbing 3-5 steps with a railing? : A Little 6 Click Score: 18    End of Session Equipment Utilized During Treatment: Gait belt Activity Tolerance: Patient tolerated treatment well Patient left: in chair;with call bell/phone within reach Nurse Communication: Other (comment) (need for assist for d/c) PT Visit Diagnosis: Difficulty in walking, not elsewhere classified (R26.2);Pain Pain - Right/Left: Left Pain - part of body: Hip     Time: 1610-9604 PT Time Calculation (min) (ACUTE ONLY): 20 min  Charges:  $Gait Training: 8-22 mins                    G CodesSheran Lawless, [place] [phone] 04/27/2017    Elray Mcgregor 04/27/2017, 5:26 PM

## 2017-04-27 NOTE — Evaluation (Signed)
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  PMH positive for anxiety, GERD, HTN, renal disorder.  Clinical Impression  PAtient presents with decreased mobility due to deficits listed in PT problem list.  She will benefit from skilled PT in the acute setting to allow return home with family support and follow up HHPT.     Follow Up Recommendations Home health PT    Equipment Recommendations  Other (comment) (TBA wants to try standard walker)    Recommendations for Other Services       Precautions / Restrictions Precautions Precautions: Fall Restrictions LLE Weight Bearing: Weight bearing as tolerated      Mobility  Bed Mobility Overal bed mobility: Needs Assistance Bed Mobility: Supine to Sit     Supine to sit: HOB elevated;Min assist     General bed mobility comments: A L LE  Transfers Overall transfer level: Needs assistance Equipment used: Rolling walker (2 wheeled) Transfers: Sit to/from [employer] Sit to Stand: Min assist Stand pivot transfers: Min assist       General transfer comment: cuse for technique, use of walker  Ambulation/Gait Ambulation/Gait assistance: Min guard;Supervision Ambulation Distance (Feet): 80 Feet Assistive device: Rolling walker (2 wheeled) Gait Pattern/deviations: Step-to pattern;Step-through pattern;Decreased stride length;Antalgic     General Gait Details: cues for sequence, safety, assist for balance  Stairs            Wheelchair Mobility    Modified Rankin (Stroke Patients Only)       Balance Overall balance assessment: Needs assistance   Sitting balance-Leahy Scale: Good       Standing balance-Leahy Scale: Fair                               Pertinent Vitals/Pain Pain Assessment: 0-10 Pain  Score: 4  Pain Location: front of L hip Pain Descriptors / Indicators: Tender;Aching Pain Intervention(s): Monitored during session;Repositioned    Home Living Family/patient expects to be discharged to:: Private residence Living Arrangements: Spouse/significant other (and son) Available Help at Discharge: Family Type of Home: House Home Access: Stairs to enter Entrance Stairs-Rails: Lawyer of Steps: 3 Home Layout: One level Home Equipment: Shower seat - built in;Walker - standard;Cane - single point;Bedside commode      Prior Function Level of Independence: Independent         Comments: Acupuncturist Dominance   Dominant Hand: Right    Extremity/Trunk Assessment   Upper Extremity Assessment Upper Extremity Assessment: Overall WFL for tasks assessed    Lower Extremity Assessment Lower Extremity Assessment: LLE deficits/detail LLE Deficits / Details: AAROM limited by pain, but at least 70 degrees hip flexion, strength at least 3/5 knee extension       Communication   Communication: No difficulties  Cognition Arousal/Alertness: Awake/alert Behavior During Therapy: WFL for tasks assessed/performed Overall Cognitive Status: Within Functional Limits for tasks assessed                                        General Comments      Exercises Total Joint Exercises Ankle Circles/Pumps: AROM;Both;10 reps;Supine Quad Sets: AROM;Both;10 reps;Supine Heel Slides: AAROM;Left;5  reps;Supine Hip ABduction/ADduction: Left;10 reps;AAROM;Supine   Assessment/Plan    PT Assessment Patient needs continued PT services  PT Problem List Decreased range of motion;Decreased strength;Decreased balance;Pain;Decreased knowledge of use of DME;Decreased safety awareness;Decreased mobility       PT Treatment Interventions DME instruction;Gait training;Balance training;Stair training;Functional mobility training;Therapeutic  exercise;Therapeutic activities;Patient/family education    PT Goals (Current goals can be found in the Care Plan section)  Acute Rehab PT Goals Patient Stated Goal: To go home PT Goal Formulation: With patient Time For Goal Achievement: 04/29/17 Potential to Achieve Goals: Good    Frequency 7X/week   Barriers to discharge        Co-evaluation               AM-PAC PT "6 Clicks" Daily Activity  Outcome Measure Difficulty turning over in bed (including adjusting bedclothes, sheets and blankets)?: A Little Difficulty moving from lying on back to sitting on the side of the bed? : Unable Difficulty sitting down on and standing up from a chair with arms (e.g., wheelchair, bedside commode, etc,.)?: Unable Help needed moving to and from a bed to chair (including a wheelchair)?: A Little Help needed walking in hospital room?: A Little Help needed climbing 3-5 steps with a railing? : A Little 6 Click Score: 14    End of Session Equipment Utilized During Treatment: Gait belt Activity Tolerance: Patient tolerated treatment well Patient left: in chair;with call bell/phone within reach Nurse Communication: Patient requests pain meds PT Visit Diagnosis: Difficulty in walking, not elsewhere classified (R26.2);Pain Pain - Right/Left: Left Pain - part of body: Hip    Time: 1420-1450 PT Time Calculation (min) (ACUTE ONLY): 30 min   Charges:   PT Evaluation $PT Eval Moderate Complexity: 1 Mod PT Treatments $Gait Training: 8-22 mins   PT G CodesSheran Lawless, [place] [phone] 04/27/2017   Elray Mcgregor 04/27/2017, 3:27 PM

## 2017-04-27 NOTE — Discharge Summary (Signed)
Physician Discharge Summary  Patient ID: Julie Cameron MRN: [id number] DOB/AGE: 59/08/1955 59 y.o.  Admit date: 04/26/2017 Discharge date: 04/28/2017  Admission Diagnoses:  Avascular necrosis of hip, left [facility])  Discharge Diagnoses:  Principal Problem:   Avascular necrosis of hip, left [facility])   Past Medical History:  Diagnosis Date  . Acid reflux   . Anxiety   . Arthritis   . Cellulitis   . Depression   . Hyperlipemia   . Hypertension   . Renal disorder   . Wears glasses     Surgeries: Procedure(s): LEFT TOTAL HIP ARTHROPLASTY ANTERIOR APPROACH on 04/26/2017   Consultants (if any):   Discharged Condition: Improved  Hospital Course: Julie Cameron is an 61 y.o. female who was admitted 04/26/2017 with a diagnosis of Avascular necrosis of hip, left (HCC) and went to the operating room on 04/26/2017 and underwent the above named procedures.    She was given perioperative antibiotics:  Anti-infectives    Start     Dose/Rate Route Frequency Ordered Stop   04/26/17 2200  ceFAZolin (ANCEF) IVPB 2g/100 mL premix     2 g 200 mL/hr over 30 Minutes Intravenous Every 6 hours 04/26/17 2022 04/27/17 0520   04/26/17 1400  ceFAZolin (ANCEF) IVPB 2g/100 mL premix     2 g 200 mL/hr over 30 Minutes Intravenous On call to O.R. 04/23/17 1047 04/26/17 1620   04/26/17 1226  ceFAZolin (ANCEF) 2-4 GM/100ML-% IVPB  Status:  Discontinued    Comments:  Lorenda Ishihara   : cabinet override      04/26/17 1226 04/26/17 1227    .  She was given sequential compression devices, early ambulation, and ASA for DVT prophylaxis.  She benefited maximally from the hospital stay and there were no complications.    Recent vital signs:  Vitals:   04/27/17 0533 04/27/17 1355  BP: 113/64 119/69  Pulse: 76 84  Resp: 12 17  Temp: 99.4 F (37.4 C) 99.1 F (37.3 C)  SpO2: 93% 96%    Recent laboratory studies:  Lab Results  Component Value Date   HGB 10.8 (L) 04/27/2017   HGB 13.5 04/21/2017   HGB  13.5 01/10/2016   Lab Results  Component Value Date   WBC 8.1 04/27/2017   PLT 183 04/27/2017   No results found for: INR Lab Results  Component Value Date   NA 134 (L) 04/27/2017   K 3.8 04/27/2017   CL 102 04/27/2017   CO2 24 04/27/2017   BUN 19 04/27/2017   CREATININE 1.23 (H) 04/27/2017   GLUCOSE 207 (H) 04/27/2017    Discharge Medications:   Allergies as of 04/27/2017   No Known Allergies     Medication List    STOP taking these medications   traMADol 50 MG tablet Commonly known as:  ULTRAM     TAKE these medications   aspirin 81 MG chewable tablet Chew 1 tablet (81 mg total) by mouth 2 (two) times daily.   buPROPion 150 MG 24 hr tablet Commonly known as:  WELLBUTRIN XL Take 300 mg by mouth daily.   cycloSPORINE 0.05 % ophthalmic emulsion Commonly known as:  RESTASIS Place 1 drop into both eyes 2 (two) times daily.   dicyclomine 10 MG capsule Commonly known as:  BENTYL Take 10 mg by mouth 4 (four) times daily as needed for spasms.   docusate sodium 100 MG capsule Commonly known as:  COLACE Take 1 capsule (100 mg total) by mouth 2 (two) times daily.  escitalopram 20 MG tablet Commonly known as:  LEXAPRO Take 20 mg by mouth daily.   Eszopiclone 3 MG Tabs Take 3 mg by mouth at bedtime. Take immediately before bedtime   hydrochlorothiazide 25 MG tablet Commonly known as:  HYDRODIURIL Take 25 mg by mouth every morning.   HYDROcodone-acetaminophen 5-325 MG tablet Commonly known as:  NORCO/VICODIN Take 1-2 tablets by mouth every 6 (six) hours as needed (breakthrough pain).   ibuprofen 200 MG tablet Commonly known as:  ADVIL,MOTRIN Take 200 mg by mouth every 6 (six) hours as needed for headache or moderate pain.   lisinopril 40 MG tablet Commonly known as:  PRINIVIL,ZESTRIL Take 40 mg by mouth daily.   multivitamin with minerals Tabs tablet Take 1 tablet by mouth at bedtime.   ondansetron 4 MG tablet Commonly known as:  ZOFRAN Take 1 tablet  (4 mg total) by mouth every 6 (six) hours as needed for nausea.   rosuvastatin 10 MG tablet Commonly known as:  CRESTOR Take 10 mg by mouth daily.   senna 8.6 MG Tabs tablet Commonly known as:  SENOKOT Take 2 tablets (17.2 mg total) by mouth at bedtime.       Diagnostic Studies: Dg Pelvis Portable  Result Date: 04/26/2017 CLINICAL DATA:  61 y/o  F; left hip replacement. EXAM: PORTABLE PELVIS 1-2 VIEWS COMPARISON:  None. FINDINGS: Total left hip arthroplasty in good alignment. No periprosthetic fracture identified. Postsurgical changes with edema and air in the surrounding soft tissues of the left proximal thigh. Right hip joint is well maintained. No pelvic fracture or diastases. IMPRESSION: Total left hip arthroplasty with expected postsurgical changes. Electronically Signed   By: Mitzi Hansen M.D.   On: 04/26/2017 19:51   Dg C-arm 1-60 Min  Result Date: 04/26/2017 CLINICAL DATA:  61 y/o  F; left anterior total hip arthroplasty. EXAM: DG C-ARM 61-120 MIN; OPERATIVE LEFT HIP WITH PELVIS COMPARISON:  None. FINDINGS: Left total hip replacement in good alignment. No acute displaced fracture identified. Fluoro time is 19 seconds. IMPRESSION: Intraoperative fluoroscopy of total left hip replacement. 19 seconds fluoro time. Electronically Signed   By: Mitzi Hansen M.D.   On: 04/26/2017 19:48   Dg Hip Operative Unilat W Or W/o Pelvis Left  Result Date: 04/26/2017 CLINICAL DATA:  61 y/o  F; left anterior total hip arthroplasty. EXAM: DG C-ARM 61-120 MIN; OPERATIVE LEFT HIP WITH PELVIS COMPARISON:  None. FINDINGS: Left total hip replacement in good alignment. No acute displaced fracture identified. Fluoro time is 19 seconds. IMPRESSION: Intraoperative fluoroscopy of total left hip replacement. 19 seconds fluoro time. Electronically Signed   By: Mitzi Hansen M.D.   On: 04/26/2017 19:48    Disposition: 01-Home or Self Care  Discharge Instructions    Call MD /  Call 911    Complete by:  As directed    If you experience chest pain or shortness of breath, CALL 911 and be transported to the hospital emergency room.  If you develope a fever above 101 F, pus (white drainage) or increased drainage or redness at the wound, or calf pain, call your surgeon's office.   Constipation Prevention    Complete by:  As directed    Drink plenty of fluids.  Prune juice may be helpful.  You may use a stool softener, such as Colace (over the counter) 100 mg twice a day.  Use MiraLax (over the counter) for constipation as needed.   Diet - low sodium heart healthy    Complete by:  As directed    Driving restrictions    Complete by:  As directed    No driving for 6 weeks   Increase activity slowly as tolerated    Complete by:  As directed    Lifting restrictions    Complete by:  As directed    No lifting for 6 weeks   TED hose    Complete by:  As directed    Use stockings (TED hose) for 2 weeks on both leg(s).  You may remove them at night for sleeping.      Follow-up Information    Kristi Hyer, Arlys John, MD. Schedule an appointment as soon as possible for a visit in 2 weeks.   Specialty:  Orthopedic Surgery Why:  For wound re-check Contact information: [address] [place] [address] [phone]           Signed: Garnet Koyanagi 04/28/2017, 10:10 AM

## 2017-04-27 NOTE — Anesthesia Postprocedure Evaluation (Signed)
Anesthesia Post Note  Patient: Julie Cameron  Procedure(s) Performed: LEFT TOTAL HIP ARTHROPLASTY ANTERIOR APPROACH (Left Hip)     Patient location during evaluation: PACU Anesthesia Type: Spinal Level of consciousness: awake and alert Pain management: pain level controlled Vital Signs Assessment: post-procedure vital signs reviewed and stable Respiratory status: spontaneous breathing and respiratory function stable Cardiovascular status: blood pressure returned to baseline and stable Postop Assessment: spinal receding Anesthetic complications: no    Last Vitals:  Vitals:   04/26/17 1935 04/26/17 1958  BP: 107/81 115/71  Pulse: 68 70  Resp: 12 12  Temp: 36.4 C 37.4 C  SpO2: 99% 99%    Last Pain:  Vitals:   04/26/17 1958  TempSrc: Oral  PainSc:                  Lewie Loron

## 2017-04-27 NOTE — Progress Notes (Signed)
   Subjective:  Patient reports pain as mild to moderate.  Denies N/V/CP/SOB.  Objective:   VITALS:   Vitals:   04/26/17 1920 04/26/17 1935 04/26/17 1958 04/27/17 0533  BP: 116/75 107/81 115/71 113/64  Pulse: 73 68 70 76  Resp: Temp:  97.6 F (36.4 C) 99.3 F (37.4 C) 99.4 F (37.4 C)  TempSrc:   Oral Oral  SpO2: 96% 99% 99% 93%  Weight:   84.7 kg (186 lb 12.8 oz)   Height:    (1.6 m)     NAD ABD soft Sensation intact distally Intact pulses distally Dorsiflexion/Plantar flexion intact Incision: dressing C/D/I Compartment soft   Lab Results  Component Value Date   WBC 8.1 04/27/2017   HGB 10.8 (L) 04/27/2017   HCT 33.7 (L) 04/27/2017   MCV 95.5 04/27/2017   PLT 183 04/27/2017   BMET    Component Value Date/Time   NA 136 04/27/2017 0702   K 3.6 04/27/2017 0702   CL 102 04/27/2017 0702   CO2 25 04/27/2017 0702   GLUCOSE 109 (H) 04/27/2017 0702   BUN 17 04/27/2017 0702   CREATININE 1.21 (H) 04/27/2017 0702   CALCIUM 8.3 (L) 04/27/2017 0702   GFRNONAA 48 (L) 04/27/2017 0702   GFRAA 56 (L) 04/27/2017 0702     Assessment/Plan: 1 Day Post-Op   Principal Problem:   Avascular necrosis of hip, left (HCC)   WBAT with walker DVT ppx; ASA, SCds, TEDs PO pain control PT/OT Dispo: recheck BMP, d/c home with HHPT today vs tomorrow depending on progress with therapy   Shelise Maron, Cloyde Reams 04/27/2017, 1:37 PM   Samson Frederic, MD Cell [phone]

## 2017-11-10 IMAGING — DX DG PORTABLE PELVIS
1 series · 1 of 1 positions shown · non-contrast
Comparison: None.

CLINICAL DATA: 61 y/o  F; left hip replacement.

EXAM:
PORTABLE PELVIS 1-2 VIEWS

[pelvis ap]
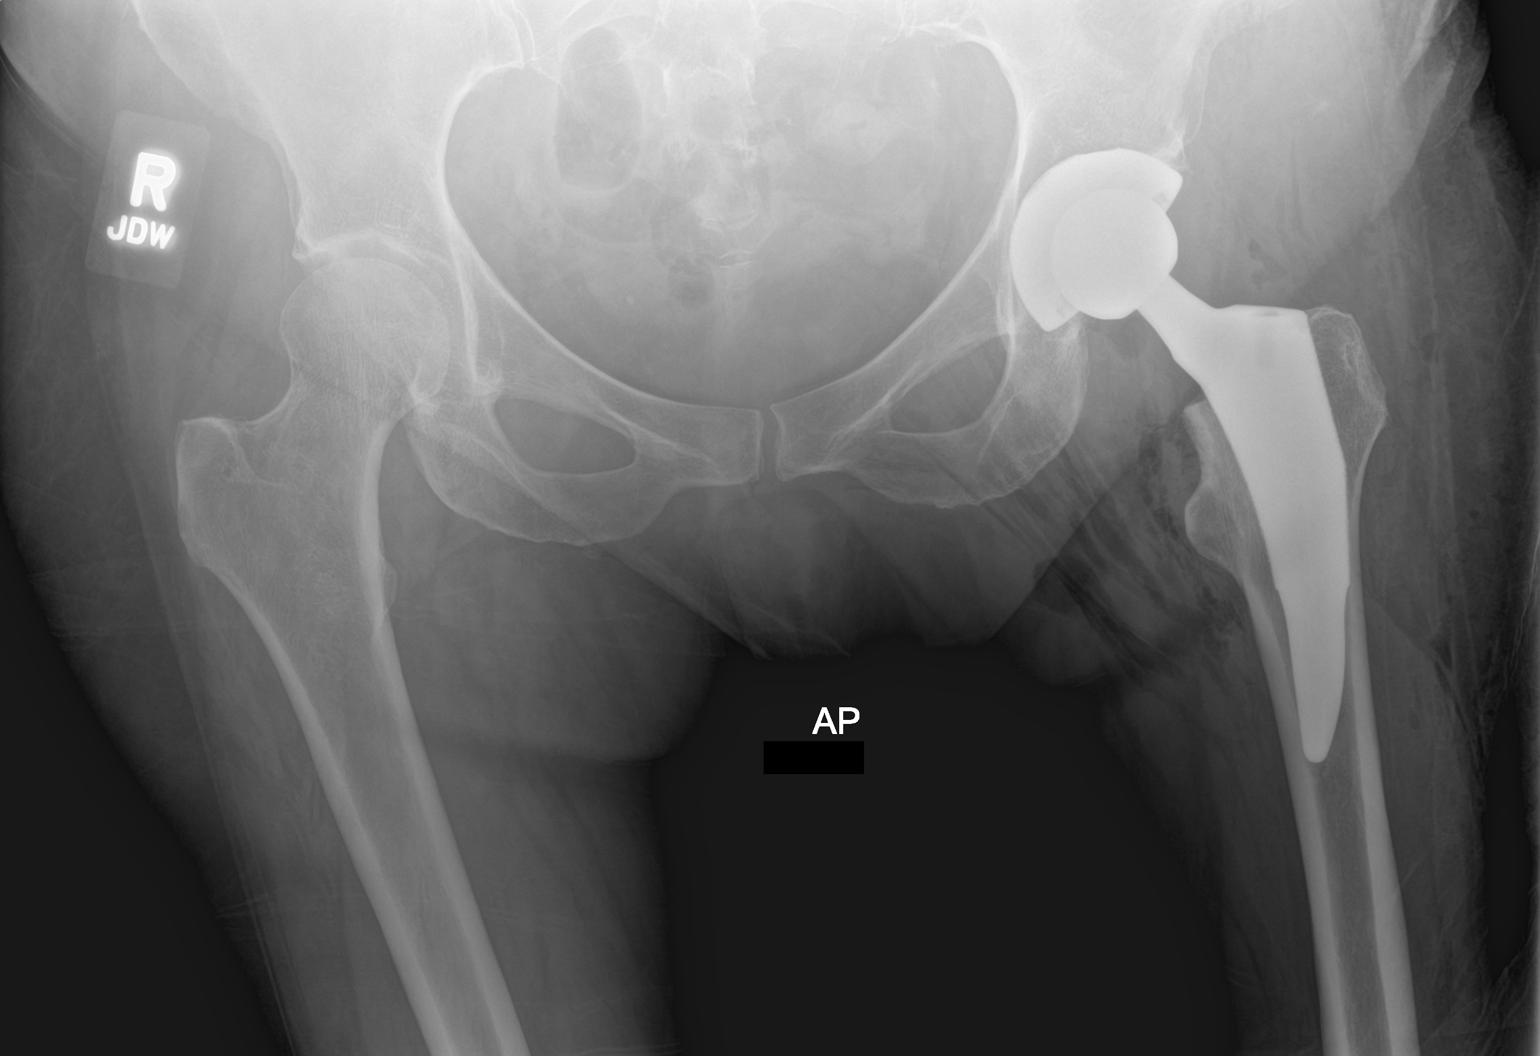

[1 of 1 positions shown; findings below may reference images not displayed]

FINDINGS: Total left hip arthroplasty in good alignment. No periprosthetic
fracture identified. Postsurgical changes with edema and air in the
surrounding soft tissues of the left proximal thigh. Right hip joint
is well maintained. No pelvic fracture or diastases.
IMPRESSION: Total left hip arthroplasty with expected postsurgical changes.

By: Vanness Danial M.D.

## 2019-01-03 ENCOUNTER — Ambulatory Visit: Payer: Self-pay | Admitting: Orthopedic Surgery

## 2019-01-16 ENCOUNTER — Ambulatory Visit: Payer: Self-pay | Admitting: Orthopedic Surgery

## 2019-01-16 NOTE — H&P (View-Only) (Signed)
TOTAL HIP ADMISSION H&P  Patient is admitted for right total hip arthroplasty.  Subjective:  Chief Complaint: right hip pain  HPI: Julie Cameron, 63 y.o. female, has a history of pain and functional disability in the right hip(s) due to AVN with collapse and patient has failed non-surgical conservative treatments for greater than 12 weeks to include NSAID's and/or analgesics, flexibility and strengthening excercises, use of assistive devices, weight reduction as appropriate and activity modification.  Onset of symptoms was gradual starting 2 years ago with gradually worsening course since that time.The patient noted no past surgery on the right hip(s).  Patient currently rates pain in the right hip at 10 out of 10 with activity. Patient has night pain, worsening of pain with activity and weight bearing, trendelenberg gait, pain that interfers with activities of daily living and pain with passive range of motion. Patient has evidence of AVN with collapse by imaging studies. This condition presents safety issues increasing the risk of falls. This patient has had avascular necrosis of the hip.  There is no current active infection.  Patient Active Problem List   Diagnosis Date Noted  . Avascular necrosis of hip, left ([place]) 04/26/2017   Past Medical History:  Diagnosis Date  . Acid reflux   . Anxiety   . Arthritis   . Cellulitis   . Depression   . Hyperlipemia   . Hypertension   . Renal disorder   . Wears glasses     Past Surgical History:  Procedure Laterality Date  . APPENDECTOMY    . COLONOSCOPY    . LAPAROSCOPIC APPENDECTOMY N/A 09/18/2012   Procedure: APPENDECTOMY LAPAROSCOPIC;  Surgeon: Donato Heinz, MD;  Location: [facility];  Service: General;  Laterality: N/A;  . SIGMOIDOSCOPY    . TOTAL HIP ARTHROPLASTY Left 04/26/2017   Procedure: LEFT TOTAL HIP ARTHROPLASTY ANTERIOR APPROACH;  Surgeon: Rod Can, MD;  Location: [facility];  Service: Orthopedics;  Laterality: Left;  Needs RNFA   . WISDOM TOOTH EXTRACTION      Current Outpatient Medications  Medication Sig Dispense Refill Last Dose  . aspirin 81 MG chewable tablet Chew 1 tablet (81 mg total) by mouth 2 (two) times daily. (Patient not taking: Reported on 01/13/2019) 60 tablet 1 Not Taking at Unknown time  . buPROPion (WELLBUTRIN XL) 300 MG 24 hr tablet Take 300 mg by mouth daily.    04/26/2017 at 1000  . cycloSPORINE (RESTASIS) 0.05 % ophthalmic emulsion Place 1 drop into both eyes 2 (two) times daily.   Past Month at Unknown time  . docusate sodium (COLACE) 100 MG capsule Take 1 capsule (100 mg total) by mouth 2 (two) times daily. (Patient not taking: Reported on 01/13/2019) 60 capsule 1 Not Taking at Unknown time  . escitalopram (LEXAPRO) 20 MG tablet Take 20 mg by mouth daily.    04/26/2017 at 1000  . Eszopiclone 3 MG TABS Take 3 mg by mouth at bedtime. Take immediately before bedtime   04/25/2017 at Unknown time  . hydrochlorothiazide (HYDRODIURIL) 25 MG tablet Take 25 mg by mouth every morning.   04/25/2017 at Unknown time  . HYDROcodone-acetaminophen (NORCO/VICODIN) 5-325 MG tablet Take 1-2 tablets by mouth every 6 (six) hours as needed (breakthrough pain). 60 tablet 0   . ibuprofen (ADVIL,MOTRIN) 200 MG tablet Take 200-400 mg by mouth every 6 (six) hours as needed for headache or moderate pain.    Past Month at Unknown time  . lisinopril (PRINIVIL,ZESTRIL) 40 MG tablet Take 40 mg by  mouth at bedtime.    04/25/2017 at Unknown time  . ondansetron (ZOFRAN) 4 MG tablet Take 1 tablet (4 mg total) by mouth every 6 (six) hours as needed for nausea. (Patient not taking: Reported on 01/13/2019) 20 tablet 0 Not Taking at Unknown time  . rosuvastatin (CRESTOR) 10 MG tablet Take 10 mg by mouth daily.   Past Week at Unknown time  . senna (SENOKOT) 8.6 MG TABS tablet Take 2 tablets (17.2 mg total) by mouth at bedtime. (Patient not taking: Reported on 01/13/2019) 120 each 0 Not Taking at Unknown time   No current facility-administered  medications for this visit.    No Known Allergies  Social History   Tobacco Use  . Smoking status: Never Smoker  . Smokeless tobacco: Never Used  Substance Use Topics  . Alcohol use: No    Family History  Problem Relation Age of Onset  . Emphysema Mother   . Lung cancer Mother   . Diabetes Mellitus II Father   . Heart attack Father      Review of Systems  Constitutional: Negative.   HENT: Negative.   Eyes: Negative.   Cardiovascular: Positive for leg swelling.  Gastrointestinal: Positive for heartburn.  Genitourinary: Negative.   Musculoskeletal: Positive for joint pain.  Skin: Negative.   Neurological: Negative.   Endo/Heme/Allergies: Negative.   Psychiatric/Behavioral: The patient is nervous/anxious.     Objective:  Physical Exam  Vitals reviewed. Constitutional: She is oriented to person, place, and time. She appears well-developed and well-nourished.  HENT:  Head: Normocephalic and atraumatic.  Eyes: Pupils are equal, round, and reactive to light. Conjunctivae and EOM are normal.  Neck: Normal range of motion. Neck supple.  Cardiovascular: Normal rate, regular rhythm and intact distal pulses.  Respiratory: Effort normal. No respiratory distress.  GI: Soft. She exhibits no distension.  Genitourinary:    Genitourinary Comments: deferred   Musculoskeletal:     Right hip: She exhibits decreased range of motion, decreased strength and bony tenderness.  Neurological: She is alert and oriented to person, place, and time. She has normal reflexes.  Skin: Skin is warm and dry.  Psychiatric: She has a normal mood and affect. Her behavior is normal. Judgment and thought content normal.    Vital signs in last 24 hours: @VSRANGES@  Labs:   Estimated body mass index is 33.09 kg/m as calculated from the following:   Height as of 04/26/17: 5' 3" (1.6 m).   Weight as of 04/26/17: 84.7 kg.   Imaging Review Plain radiographs demonstrate severe degenerative joint  disease of the right hip(s). The bone quality appears to be adequate for age and reported activity level.      Assessment/Plan:  AVN, right hip(s)  The patient history, physical examination, clinical judgement of the provider and imaging studies are consistent with end stage degenerative joint disease of the right hip(s) and total hip arthroplasty is deemed medically necessary. The treatment options including medical management, injection therapy, arthroscopy and arthroplasty were discussed at length. The risks and benefits of total hip arthroplasty were presented and reviewed. The risks due to aseptic loosening, infection, stiffness, dislocation/subluxation,  thromboembolic complications and other imponderables were discussed.  The patient acknowledged the explanation, agreed to proceed with the plan and consent was signed. Patient is being admitted for inpatient treatment for surgery, pain control, PT, OT, prophylactic antibiotics, VTE prophylaxis, progressive ambulation and ADL's and discharge planning.The patient is planning to be discharged home with HEP    Patient's anticipated LOS   is less than 2 midnights, meeting these requirements: - Younger than 77 - Lives within 1 hour of care - Has a competent adult at home to recover with post-op recover - NO history of  - Chronic pain requiring opiods  - Diabetes  - Coronary Artery Disease  - Heart failure  - Heart attack  - Stroke  - DVT/VTE  - Cardiac arrhythmia  - Respiratory Failure/COPD  - Renal failure  - Anemia  - Advanced Liver disease

## 2019-01-16 NOTE — H&P (Signed)
TOTAL HIP ADMISSION H&P  Patient is admitted for right total hip arthroplasty.  Subjective:  Chief Complaint: right hip pain  HPI: Julie Cameron, 63 y.o. female, has a history of pain and functional disability in the right hip(s) due to AVN with collapse and patient has failed non-surgical conservative treatments for greater than 12 weeks to include NSAID's and/or analgesics, flexibility and strengthening excercises, use of assistive devices, weight reduction as appropriate and activity modification.  Onset of symptoms was gradual starting 2 years ago with gradually worsening course since that time.The patient noted no past surgery on the right hip(s).  Patient currently rates pain in the right hip at 10 out of 10 with activity. Patient has night pain, worsening of pain with activity and weight bearing, trendelenberg gait, pain that interfers with activities of daily living and pain with passive range of motion. Patient has evidence of AVN with collapse by imaging studies. This condition presents safety issues increasing the risk of falls. This patient has had avascular necrosis of the hip.  There is no current active infection.  Patient Active Problem List   Diagnosis Date Noted  . Avascular necrosis of hip, left ([place]) 04/26/2017   Past Medical History:  Diagnosis Date  . Acid reflux   . Anxiety   . Arthritis   . Cellulitis   . Depression   . Hyperlipemia   . Hypertension   . Renal disorder   . Wears glasses     Past Surgical History:  Procedure Laterality Date  . APPENDECTOMY    . COLONOSCOPY    . LAPAROSCOPIC APPENDECTOMY N/A 09/18/2012   Procedure: APPENDECTOMY LAPAROSCOPIC;  Surgeon: Donato Heinz, MD;  Location: [facility];  Service: General;  Laterality: N/A;  . SIGMOIDOSCOPY    . TOTAL HIP ARTHROPLASTY Left 04/26/2017   Procedure: LEFT TOTAL HIP ARTHROPLASTY ANTERIOR APPROACH;  Surgeon: Rod Can, MD;  Location: [facility];  Service: Orthopedics;  Laterality: Left;  Needs RNFA   . WISDOM TOOTH EXTRACTION      Current Outpatient Medications  Medication Sig Dispense Refill Last Dose  . aspirin 81 MG chewable tablet Chew 1 tablet (81 mg total) by mouth 2 (two) times daily. (Patient not taking: Reported on 01/13/2019) 60 tablet 1 Not Taking at Unknown time  . buPROPion (WELLBUTRIN XL) 300 MG 24 hr tablet Take 300 mg by mouth daily.    04/26/2017 at 1000  . cycloSPORINE (RESTASIS) 0.05 % ophthalmic emulsion Place 1 drop into both eyes 2 (two) times daily.   Past Month at Unknown time  . docusate sodium (COLACE) 100 MG capsule Take 1 capsule (100 mg total) by mouth 2 (two) times daily. (Patient not taking: Reported on 01/13/2019) 60 capsule 1 Not Taking at Unknown time  . escitalopram (LEXAPRO) 20 MG tablet Take 20 mg by mouth daily.    04/26/2017 at 1000  . Eszopiclone 3 MG TABS Take 3 mg by mouth at bedtime. Take immediately before bedtime   04/25/2017 at Unknown time  . hydrochlorothiazide (HYDRODIURIL) 25 MG tablet Take 25 mg by mouth every morning.   04/25/2017 at Unknown time  . HYDROcodone-acetaminophen (NORCO/VICODIN) 5-325 MG tablet Take 1-2 tablets by mouth every 6 (six) hours as needed (breakthrough pain). 60 tablet 0   . ibuprofen (ADVIL,MOTRIN) 200 MG tablet Take 200-400 mg by mouth every 6 (six) hours as needed for headache or moderate pain.    Past Month at Unknown time  . lisinopril (PRINIVIL,ZESTRIL) 40 MG tablet Take 40 mg by  mouth at bedtime.    04/25/2017 at Unknown time  . ondansetron (ZOFRAN) 4 MG tablet Take 1 tablet (4 mg total) by mouth every 6 (six) hours as needed for nausea. (Patient not taking: Reported on 01/13/2019) 20 tablet 0 Not Taking at Unknown time  . rosuvastatin (CRESTOR) 10 MG tablet Take 10 mg by mouth daily.   Past Week at Unknown time  . senna (SENOKOT) 8.6 MG TABS tablet Take 2 tablets (17.2 mg total) by mouth at bedtime. (Patient not taking: Reported on 01/13/2019) 120 each 0 Not Taking at Unknown time   No current facility-administered  medications for this visit.    No Known Allergies  Social History   Tobacco Use  . Smoking status: Never Smoker  . Smokeless tobacco: Never Used  Substance Use Topics  . Alcohol use: No    Family History  Problem Relation Age of Onset  . Emphysema Mother   . Lung cancer Mother   . Diabetes Mellitus II Father   . Heart attack Father      Review of Systems  Constitutional: Negative.   HENT: Negative.   Eyes: Negative.   Cardiovascular: Positive for leg swelling.  Gastrointestinal: Positive for heartburn.  Genitourinary: Negative.   Musculoskeletal: Positive for joint pain.  Skin: Negative.   Neurological: Negative.   Endo/Heme/Allergies: Negative.   Psychiatric/Behavioral: The patient is nervous/anxious.     Objective:  Physical Exam  Vitals reviewed. Constitutional: She is oriented to person, place, and time. She appears well-developed and well-nourished.  HENT:  Head: Normocephalic and atraumatic.  Eyes: Pupils are equal, round, and reactive to light. Conjunctivae and EOM are normal.  Neck: Normal range of motion. Neck supple.  Cardiovascular: Normal rate, regular rhythm and intact distal pulses.  Respiratory: Effort normal. No respiratory distress.  GI: Soft. She exhibits no distension.  Genitourinary:    Genitourinary Comments: deferred   Musculoskeletal:     Right hip: She exhibits decreased range of motion, decreased strength and bony tenderness.  Neurological: She is alert and oriented to person, place, and time. She has normal reflexes.  Skin: Skin is warm and dry.  Psychiatric: She has a normal mood and affect. Her behavior is normal. Judgment and thought content normal.    Vital signs in last 24 hours: @VSRANGES @  Labs:   Estimated body mass index is 33.09 kg/m as calculated from the following:   Height as of 04/26/17: 5\' 3"  (1.6 m).   Weight as of 04/26/17: 84.7 kg.   Imaging Review Plain radiographs demonstrate severe degenerative joint  disease of the right hip(s). The bone quality appears to be adequate for age and reported activity level.      Assessment/Plan:  AVN, right hip(s)  The patient history, physical examination, clinical judgement of the provider and imaging studies are consistent with end stage degenerative joint disease of the right hip(s) and total hip arthroplasty is deemed medically necessary. The treatment options including medical management, injection therapy, arthroscopy and arthroplasty were discussed at length. The risks and benefits of total hip arthroplasty were presented and reviewed. The risks due to aseptic loosening, infection, stiffness, dislocation/subluxation,  thromboembolic complications and other imponderables were discussed.  The patient acknowledged the explanation, agreed to proceed with the plan and consent was signed. Patient is being admitted for inpatient treatment for surgery, pain control, PT, OT, prophylactic antibiotics, VTE prophylaxis, progressive ambulation and ADL's and discharge planning.The patient is planning to be discharged home with HEP    Patient's anticipated LOS  is less than 2 midnights, meeting these requirements: - Younger than 77 - Lives within 1 hour of care - Has a competent adult at home to recover with post-op recover - NO history of  - Chronic pain requiring opiods  - Diabetes  - Coronary Artery Disease  - Heart failure  - Heart attack  - Stroke  - DVT/VTE  - Cardiac arrhythmia  - Respiratory Failure/COPD  - Renal failure  - Anemia  - Advanced Liver disease

## 2019-01-19 NOTE — Patient Instructions (Addendum)
YOU HAD A COVID 19 TEST ON 01-21-2019. ONCE YOUR COVID TEST IS COMPLETED, PLEASE BEGIN THE QUARANTINE INSTRUCTIONS AS OUTLINED IN YOUR HANDOUT.                Julie BridegroomJanet M Cameron    Your procedure is scheduled on: 01-25-2019   [facility]  Report to admitting at 8:35 AM      Call this number if you have problems the morning of surgery [phone]     Remember: BRUSH YOUR TEETH MORNING OF SURGERY AND RINSE YOUR MOUTH OUT, NO CHEWING GUM CANDY OR MINTS.   NO SOLID FOOD AFTER MIDNIGHT THE NIGHT PRIOR TO SURGERY . NOTHING BY MOUTH EXCEPT CLEAR LIQUIDS UNTIL 8:05 AM.  PLEASE FINISH ENSURE DRINK PER SURGEON ORDER 3 HOURS PRIOR TO SCHEDULED SURGERY TIME WHICH NEEDS TO BE COMPLETED AT 8:05 AM.   CLEAR LIQUID DIET   Foods Allowed                                                                     Foods Excluded  Coffee and tea, regular and decaf                             liquids that you cannot  Plain Jell-O in any flavor                                             see through such as: Fruit ices (not with fruit pulp)                                     milk, soups, orange juice  Iced Popsicles                                    All solid food Carbonated beverages, regular and diet                                    Cranberry, grape and apple juices Sports drinks like Gatorade Lightly seasoned clear broth or consume(fat free) Sugar, honey syrup  Sample Menu Breakfast                                Lunch                                     Supper Cranberry juice                    Beef broth                            Chicken broth Jell-O  Grape juice                           Apple juice Coffee or tea                        Jell-O                                      Popsicle                                                Coffee or tea                        Coffee or  tea  _____________________________________________________________________     Take these medicines the morning of surgery with A SIP OF WATER:  Wellbutrin, Lexopro                                You may not have any metal on your body including hair pins and              piercings              Do not wear jewelry, make-up, lotions, powders or perfumes, deodorant             Do not wear nail polish.  Do not shave  48 hours prior to surgery.                 Do not bring valuables to the hospital. [place].  Contacts, dentures or bridgework may not be worn into surgery.       _____________________________________________________________________             [facility] - Preparing for Surgery Before surgery, you can play an important role.  Because skin is not sterile, your skin needs to be as free of germs as possible.   You can reduce the number of germs on your skin by washing with CHG (chlorahexidine gluconate) soap before surgery.   CHG is an antiseptic cleaner which kills germs and bonds with the skin to continue killing germs even after washing. Please DO NOT use if you have an allergy to CHG or antibacterial soaps.   If your skin becomes reddened/irritated stop using the CHG and inform your nurse when you arrive at Short Stay. Do not shave (including legs and underarms) for at least 48 hours prior to the first CHG shower.  Please follow these instructions carefully:  1.  Shower with CHG Soap the night before surgery and the  morning of Surgery.  2.  If you choose to wash your hair, wash your hair first as usual with your  normal  shampoo.  3.  After you shampoo, rinse your hair and body thoroughly to remove the  shampoo.  4.  Use CHG as you would any other liquid soap.  You can apply chg directly  to the skin and wash                       Gently with a scrungie or clean washcloth.  5.   Apply the CHG Soap to your body ONLY FROM THE NECK DOWN.   Do not use on face/ open                           Wound or open sores. Avoid contact with eyes, ears mouth and genitals (private parts).                       Wash face,  Genitals (private parts) with your normal soap.             6.  Wash thoroughly, paying special attention to the area where your surgery  will be performed.  7.  Thoroughly rinse your body with warm water from the neck down.  8.  DO NOT shower/wash with your normal soap after using and rinsing off  the CHG Soap.                9.  Pat yourself dry with a clean towel.            10.  Wear clean pajamas.            11.  Place clean sheets on your bed the night of your first shower and do not  sleep with pets. Day of Surgery : Do not apply any lotions/deodorants the morning of surgery.  Please wear clean clothes to the hospital/surgery center.  FAILURE TO FOLLOW THESE INSTRUCTIONS MAY RESULT IN THE CANCELLATION OF YOUR SURGERY PATIENT SIGNATURE_________________________________  NURSE SIGNATURE__________________________________  ________________________________________________________________________   Julie Cameron  An incentive spirometer is a tool that can help keep your lungs clear and active. This tool measures how well you are filling your lungs with each breath. Taking long deep breaths may help reverse or decrease the chance of developing breathing (pulmonary) problems (especially infection) following:  A long period of time when you are unable to move or be active. BEFORE THE PROCEDURE   If the spirometer includes an indicator to show your best effort, your nurse or respiratory therapist will set it to a desired goal.  If possible, sit up straight or lean slightly forward. Try not to slouch.  Hold the incentive spirometer in an upright position. INSTRUCTIONS FOR USE  1. Sit on the edge of your bed if possible, or sit up as far as you can in bed  or on a chair. 2. Hold the incentive spirometer in an upright position. 3. Breathe out normally. 4. Place the mouthpiece in your mouth and seal your lips tightly around it. 5. Breathe in slowly and as deeply as possible, raising the piston or the ball toward the top of the column. 6. Hold your breath for 3-5 seconds or for as long as possible. Allow the piston or ball to fall to the bottom of the column. 7. Remove the mouthpiece from your mouth and breathe out normally. 8. Rest for a few seconds and repeat Steps 1 through 7 at least 10 times every 1-2 hours when you are awake. Take your time and take a few normal breaths between deep breaths. 9. The spirometer may include an indicator to  show your best effort. Use the indicator as a goal to work toward during each repetition. 10. After each set of 10 deep breaths, practice coughing to be sure your lungs are clear. If you have an incision (the cut made at the time of surgery), support your incision when coughing by placing a pillow or rolled up towels firmly against it. Once you are able to get out of bed, walk around indoors and cough well. You may stop using the incentive spirometer when instructed by your caregiver.  RISKS AND COMPLICATIONS  Take your time so you do not get dizzy or light-headed.  If you are in pain, you may need to take or ask for pain medication before doing incentive spirometry. It is harder to take a deep breath if you are having pain. AFTER USE  Rest and breathe slowly and easily.  It can be helpful to keep track of a log of your progress. Your caregiver can provide you with a simple table to help with this. If you are using the spirometer at home, follow these instructions: [place] IF:   You are having difficultly using the spirometer.  You have trouble using the spirometer as often as instructed.  Your pain medication is not giving enough relief while using the spirometer.  You develop fever of 100.5  F (38.1 C) or higher. SEEK IMMEDIATE MEDICAL CARE IF:   You cough up bloody sputum that had not been present before.  You develop fever of 102 F (38.9 C) or greater.  You develop worsening pain at or near the incision site. MAKE SURE YOU:   Understand these instructions.  Will watch your condition.  Will get help right away if you are not doing well or get worse. Document Released: 11/09/2006 Document Revised: 09/21/2011 Document Reviewed: 01/10/2007 ExitCare Patient Information 2014 ExitCare, [place].   ________________________________________________________________________  WHAT IS A BLOOD TRANSFUSION? Blood Transfusion Information  A transfusion is the replacement of blood or some of its parts. Blood is made up of multiple cells which provide different functions.  Red blood cells carry oxygen and are used for blood loss replacement.  White blood cells fight against infection.  Platelets control bleeding.  Plasma helps clot blood.  Other blood products are available for specialized needs, such as hemophilia or other clotting disorders. BEFORE THE TRANSFUSION  Who gives blood for transfusions?   Healthy volunteers who are fully evaluated to make sure their blood is safe. This is blood bank blood. Transfusion therapy is the safest it has ever been in the practice of medicine. Before blood is taken from a donor, a complete history is taken to make sure that person has no history of diseases nor engages in risky social behavior (examples are intravenous drug use or sexual activity with multiple partners). The donor's travel history is screened to minimize risk of transmitting infections, such as malaria. The donated blood is tested for signs of infectious diseases, such as HIV and hepatitis. The blood is then tested to be sure it is compatible with you in order to minimize the chance of a transfusion reaction. If you or a relative donates blood, this is often done in  anticipation of surgery and is not appropriate for emergency situations. It takes many days to process the donated blood. RISKS AND COMPLICATIONS Although transfusion therapy is very safe and saves many lives, the main dangers of transfusion include:   Getting an infectious disease.  Developing a transfusion reaction. This is an allergic reaction  to something in the blood you were given. Every precaution is taken to prevent this. The decision to have a blood transfusion has been considered carefully by your caregiver before blood is given. Blood is not given unless the benefits outweigh the risks. AFTER THE TRANSFUSION  Right after receiving a blood transfusion, you will usually feel much better and more energetic. This is especially true if your red blood cells have gotten low (anemic). The transfusion raises the level of the red blood cells which carry oxygen, and this usually causes an energy increase.  The nurse administering the transfusion will monitor you carefully for complications. HOME CARE INSTRUCTIONS  No special instructions are needed after a transfusion. You may find your energy is better. Speak with your caregiver about any limitations on activity for underlying diseases you may have. SEEK MEDICAL CARE IF:   Your condition is not improving after your transfusion.  You develop redness or irritation at the intravenous (IV) site. SEEK IMMEDIATE MEDICAL CARE IF:  Any of the following symptoms occur over the next 12 hours:  Shaking chills.  You have a temperature by mouth above 102 F (38.9 C), not controlled by medicine.  Chest, back, or muscle pain.  People around you feel you are not acting correctly or are confused.  Shortness of breath or difficulty breathing.  Dizziness and fainting.  You get a rash or develop hives.  You have a decrease in urine output.  Your urine turns a dark color or changes to pink, red, or brown. Any of the following symptoms occur over  the next 10 days:  You have a temperature by mouth above 102 F (38.9 C), not controlled by medicine.  Shortness of breath.  Weakness after normal activity.  The white part of the eye turns yellow (jaundice).  You have a decrease in the amount of urine or are urinating less often.  Your urine turns a dark color or changes to pink, red, or brown. Document Released: 06/26/2000 Document Revised: 09/21/2011 Document Reviewed: 02/13/2008 [facility] Patient Information 2014 [place], [place].  _______________________________________________________________________

## 2019-01-21 ENCOUNTER — Other Ambulatory Visit (HOSPITAL_COMMUNITY)
Admission: RE | Admit: 2019-01-21 | Discharge: 2019-01-21 | Disposition: A | Discharge status: Home / Self Care | Payer: Commercial Managed Care - PPO | Source: Ambulatory Visit | Attending: Orthopedic Surgery | Admitting: Orthopedic Surgery

## 2019-01-21 DIAGNOSIS — Z01812 Encounter for preprocedural laboratory examination: Secondary | ICD-10-CM | POA: Insufficient documentation

## 2019-01-21 DIAGNOSIS — Z1159 Encounter for screening for other viral diseases: Secondary | ICD-10-CM | POA: Insufficient documentation

## 2019-01-21 LAB — SARS CORONAVIRUS 2 (TAT 6-24 HRS): SARS Coronavirus 2: NEGATIVE

## 2019-01-23 ENCOUNTER — Encounter (HOSPITAL_COMMUNITY)
Admission: RE | Admit: 2019-01-23 | Discharge: 2019-01-23 | Disposition: A | Discharge status: Home / Self Care | Payer: Commercial Managed Care - PPO | Source: Ambulatory Visit | Attending: Orthopedic Surgery | Admitting: Orthopedic Surgery

## 2019-01-23 ENCOUNTER — Encounter (HOSPITAL_COMMUNITY): Payer: Self-pay

## 2019-01-23 ENCOUNTER — Other Ambulatory Visit: Payer: Self-pay

## 2019-01-23 LAB — COMPREHENSIVE METABOLIC PANEL
ALT: 35 U/L (ref 0–44)
AST: 44 U/L — ABNORMAL HIGH (ref 15–41)
Albumin: 4.3 g/dL (ref 3.5–5.0)
Alkaline Phosphatase: 76 U/L (ref 38–126)
Anion gap: 13 (ref 5–15)
BUN: 22 mg/dL (ref 8–23)
CO2: 27 mmol/L (ref 22–32)
Calcium: 9.8 mg/dL (ref 8.9–10.3)
Chloride: 101 mmol/L (ref 98–111)
Creatinine, Ser: 0.92 mg/dL (ref 0.44–1.00)
GFR calc Af Amer: >60 mL/min (ref >60)
GFR calc non Af Amer: >60 mL/min (ref >60)
Glucose, Bld: 96 mg/dL (ref 70–99)
Potassium: 4.1 mmol/L (ref 3.5–5.1)
Sodium: 141 mmol/L (ref 135–145)
Total Bilirubin: 1.2 mg/dL (ref 0.3–1.2)
Total Protein: 7.6 g/dL (ref 6.5–8.1)

## 2019-01-23 LAB — URINALYSIS, ROUTINE W REFLEX MICROSCOPIC
Bilirubin Urine: NEGATIVE
Glucose, UA: NEGATIVE mg/dL
Hgb urine dipstick: NEGATIVE
Ketones, ur: 5 mg/dL — AB
Leukocytes,Ua: NEGATIVE
Nitrite: NEGATIVE
Protein, ur: NEGATIVE mg/dL
Specific Gravity, Urine: 1.025 (ref 1.005–1.030)
pH: 5 (ref 5.0–8.0)

## 2019-01-23 LAB — CBC
HCT: 44.4 % (ref 36.0–46.0)
Hemoglobin: 14.2 g/dL (ref 12.0–15.0)
MCH: 31.2 pg (ref 26.0–34.0)
MCHC: 32 g/dL (ref 30.0–36.0)
MCV: 97.6 fL (ref 80.0–100.0)
Platelets: 285 10*3/uL (ref 150–400)
RBC: 4.55 MIL/uL (ref 3.87–5.11)
RDW: 11.9 % (ref 11.5–15.5)
WBC: 8.1 10*3/uL (ref 4.0–10.5)
nRBC: 0 % (ref 0.0–0.2)

## 2019-01-23 LAB — APTT: aPTT: 27 seconds (ref 24–36)

## 2019-01-23 LAB — PROTIME-INR
INR: 1 (ref 0.8–1.2)
Prothrombin Time: 13.3 seconds (ref 11.4–15.2)

## 2019-01-23 LAB — SURGICAL PCR SCREEN
MRSA, PCR: NEGATIVE
Staphylococcus aureus: NEGATIVE

## 2019-01-23 LAB — ABO/RH: ABO/RH(D): O POS

## 2019-01-24 NOTE — Progress Notes (Signed)
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    VS: BP 122/70   Pulse 70   Temp 36.9 C (Oral)   Resp 18   Ht 5\' 3"  (1.6 m)   Wt 80.7 kg   SpO2 95%   BMI 31.53 kg/m    PROVIDERS: - PCP is Asencion Noble, MD   LABS: Labs reviewed: Acceptable for surgery. (all labs ordered are listed, but only abnormal results are displayed)  Labs Reviewed  COMPREHENSIVE METABOLIC PANEL - Abnormal; Notable for the following components:      Result Value   AST 44 (*)    All other components within normal limits  URINALYSIS, ROUTINE W REFLEX MICROSCOPIC - Abnormal; Notable for the following components:   APPearance HAZY (*)    Ketones, ur 5 (*)    All other components within normal limits  SURGICAL PCR SCREEN  APTT  CBC  PROTIME-INR  TYPE AND SCREEN  ABO/RH    EKG 01/23/19: NSR. LAD. Possible anterior infarct, age undetermined.  Poor R wave progression intermittently present on EKG- see EKG 09/18/12   Past Medical History:  Diagnosis Date  . Acid reflux   . Anxiety   . Arthritis   . Cellulitis   . Depression   . Hyperlipemia   . Hypertension   . Renal disorder    Dr. Deuturding  . Wears glasses     Past Surgical History:  Procedure Laterality Date  . APPENDECTOMY    . COLONOSCOPY    . JOINT REPLACEMENT     Lt hip  . LAPAROSCOPIC APPENDECTOMY N/A 09/18/2012   Procedure: APPENDECTOMY LAPAROSCOPIC;  Surgeon: Donato Heinz, MD;  Location: [facility];  Service: General;  Laterality: N/A;  . SIGMOIDOSCOPY    . TOTAL HIP ARTHROPLASTY Left 04/26/2017   Procedure: LEFT TOTAL HIP ARTHROPLASTY ANTERIOR APPROACH;  Surgeon: Rod Can, MD;  Location: [place];  Service: Orthopedics;  Laterality: Left;   Needs RNFA  . WISDOM TOOTH EXTRACTION      MEDICATIONS: . aspirin 81 MG chewable tablet  . buPROPion (WELLBUTRIN XL) 300 MG 24 hr tablet  . cycloSPORINE (RESTASIS) 0.05 % ophthalmic emulsion  . docusate sodium (COLACE) 100 MG capsule  . escitalopram (LEXAPRO) 20 MG tablet  . Eszopiclone 3 MG TABS  . hydrochlorothiazide (HYDRODIURIL) 25 MG tablet  . HYDROcodone-acetaminophen (NORCO/VICODIN) 5-325 MG tablet  . ibuprofen (ADVIL,MOTRIN) 200 MG tablet  . lisinopril (PRINIVIL,ZESTRIL) 40 MG tablet  . ondansetron (ZOFRAN) 4 MG tablet  . rosuvastatin (CRESTOR) 10 MG tablet  . senna (SENOKOT) 8.6 MG TABS tablet   No current facility-administered medications for this encounter.     If no changes, I anticipate pt can proceed with surgery as scheduled.   Willeen Cass, FNP-BC [facility] Short Stay Surgical Center/Anesthesiology Phone: [phone] 01/24/2019 1:40 PM

## 2019-01-24 NOTE — Anesthesia Preprocedure Evaluation (Addendum)
Anesthesia Evaluation  Patient identified by MRN, date of birth, ID band Patient awake    Reviewed: Allergy & Precautions, NPO status , Patient's Chart, lab work & pertinent test results  Airway Mallampati: III  TM Distance: <3 FB Neck ROM: Full    Dental no notable dental hx.    Pulmonary neg pulmonary ROS, former smoker,    Pulmonary exam normal breath sounds clear to auscultation       Cardiovascular hypertension, Normal cardiovascular exam Rhythm:Regular Rate:Normal     Neuro/Psych Anxiety negative neurological ROS     GI/Hepatic Neg liver ROS, GERD  ,  Endo/Other  negative endocrine ROS  Renal/GU negative Renal ROS  negative genitourinary   Musculoskeletal negative musculoskeletal ROS (+)   Abdominal   Peds negative pediatric ROS (+)  Hematology negative hematology ROS (+)   Anesthesia Other Findings   Reproductive/Obstetrics negative OB ROS                            Anesthesia Physical Anesthesia Plan  ASA: II  Anesthesia Plan: Spinal   Post-op Pain Management:    Induction: Intravenous  PONV Risk Score and Plan: 2 and Ondansetron, Dexamethasone and Treatment may vary due to age or medical condition  Airway Management Planned: Simple Face Mask  Additional Equipment:   Intra-op Plan:   Post-operative Plan:   Informed Consent: I have reviewed the patients History and Physical, chart, labs and discussed the procedure including the risks, benefits and alternatives for the proposed anesthesia with the patient or authorized representative who has indicated his/her understanding and acceptance.     Dental advisory given  Plan Discussed with: CRNA and Surgeon  Anesthesia Plan Comments: (See APP note by Willeen Cass, FNP)       Anesthesia Quick Evaluation

## 2019-01-25 ENCOUNTER — Inpatient Hospital Stay (HOSPITAL_COMMUNITY)
Admission: RE | Admit: 2019-01-25 | Discharge: 2019-01-26 | DRG: 470 | Disposition: A | Discharge status: Home / Self Care | Payer: Commercial Managed Care - PPO | Attending: Orthopedic Surgery | Admitting: Orthopedic Surgery

## 2019-01-25 ENCOUNTER — Encounter (HOSPITAL_COMMUNITY): Payer: Self-pay | Admitting: Emergency Medicine

## 2019-01-25 ENCOUNTER — Inpatient Hospital Stay (HOSPITAL_COMMUNITY): Payer: Commercial Managed Care - PPO | Admitting: Emergency Medicine

## 2019-01-25 ENCOUNTER — Inpatient Hospital Stay (HOSPITAL_COMMUNITY): Payer: Commercial Managed Care - PPO

## 2019-01-25 ENCOUNTER — Inpatient Hospital Stay (HOSPITAL_COMMUNITY): Payer: Commercial Managed Care - PPO | Admitting: Certified Registered Nurse Anesthetist

## 2019-01-25 ENCOUNTER — Encounter (HOSPITAL_COMMUNITY)
Admission: RE | Disposition: A | Discharge status: Home / Self Care | Payer: Self-pay | Source: Home / Self Care | Attending: Orthopedic Surgery

## 2019-01-25 ENCOUNTER — Other Ambulatory Visit: Payer: Self-pay

## 2019-01-25 DIAGNOSIS — Z79899 Other long term (current) drug therapy: Secondary | ICD-10-CM

## 2019-01-25 DIAGNOSIS — F419 Anxiety disorder, unspecified: Secondary | ICD-10-CM | POA: Diagnosis present

## 2019-01-25 DIAGNOSIS — M25851 Other specified joint disorders, right hip: Secondary | ICD-10-CM | POA: Diagnosis present

## 2019-01-25 DIAGNOSIS — M87051 Idiopathic aseptic necrosis of right femur: Secondary | ICD-10-CM | POA: Diagnosis present

## 2019-01-25 DIAGNOSIS — M1611 Unilateral primary osteoarthritis, right hip: Secondary | ICD-10-CM | POA: Diagnosis present

## 2019-01-25 DIAGNOSIS — F329 Major depressive disorder, single episode, unspecified: Secondary | ICD-10-CM | POA: Diagnosis present

## 2019-01-25 DIAGNOSIS — I10 Essential (primary) hypertension: Secondary | ICD-10-CM | POA: Diagnosis present

## 2019-01-25 DIAGNOSIS — Z833 Family history of diabetes mellitus: Secondary | ICD-10-CM | POA: Diagnosis not present

## 2019-01-25 DIAGNOSIS — K219 Gastro-esophageal reflux disease without esophagitis: Secondary | ICD-10-CM | POA: Diagnosis present

## 2019-01-25 DIAGNOSIS — Z801 Family history of malignant neoplasm of trachea, bronchus and lung: Secondary | ICD-10-CM | POA: Diagnosis not present

## 2019-01-25 DIAGNOSIS — Z96642 Presence of left artificial hip joint: Secondary | ICD-10-CM | POA: Diagnosis present

## 2019-01-25 DIAGNOSIS — Z8249 Family history of ischemic heart disease and other diseases of the circulatory system: Secondary | ICD-10-CM

## 2019-01-25 DIAGNOSIS — Z825 Family history of asthma and other chronic lower respiratory diseases: Secondary | ICD-10-CM | POA: Diagnosis not present

## 2019-01-25 DIAGNOSIS — E785 Hyperlipidemia, unspecified: Secondary | ICD-10-CM | POA: Diagnosis present

## 2019-01-25 DIAGNOSIS — Z419 Encounter for procedure for purposes other than remedying health state, unspecified: Secondary | ICD-10-CM

## 2019-01-25 DIAGNOSIS — Z09 Encounter for follow-up examination after completed treatment for conditions other than malignant neoplasm: Secondary | ICD-10-CM

## 2019-01-25 HISTORY — PX: TOTAL HIP ARTHROPLASTY: SHX124

## 2019-01-25 LAB — TYPE AND SCREEN
ABO/RH(D): O POS
Antibody Screen: NEGATIVE

## 2019-01-25 SURGERY — ARTHROPLASTY, HIP, TOTAL, ANTERIOR APPROACH
Anesthesia: Spinal | Site: Hip | Laterality: Right

## 2019-01-25 MED ORDER — ESCITALOPRAM OXALATE 20 MG PO TABS
20.0000 mg | ORAL_TABLET | Freq: Every day | ORAL | Status: DC
  Administered 2019-01-25 – 2019-01-26 (×2): 20 mg via ORAL
  Filled 2019-01-25 (×2): qty 1

## 2019-01-25 MED ORDER — METHOCARBAMOL 500 MG IVPB - SIMPLE MED
500.0000 mg | Freq: Four times a day (QID) | INTRAVENOUS | Status: DC | PRN
  Filled 2019-01-25: qty 50

## 2019-01-25 MED ORDER — PROPOFOL 10 MG/ML IV BOLUS
INTRAVENOUS | Status: AC
  Filled 2019-01-25: qty 40

## 2019-01-25 MED ORDER — BUPROPION HCL ER (XL) 300 MG PO TB24
300.0000 mg | ORAL_TABLET | Freq: Every day | ORAL | Status: DC
  Administered 2019-01-25 – 2019-01-26 (×2): 300 mg via ORAL
  Filled 2019-01-25 (×2): qty 1

## 2019-01-25 MED ORDER — ALUM & MAG HYDROXIDE-SIMETH 200-200-20 MG/5ML PO SUSP: 30.0000 mL | ORAL | Status: DC | PRN

## 2019-01-25 MED ORDER — METOCLOPRAMIDE HCL 5 MG/ML IJ SOLN: 5.0000 mg | Freq: Three times a day (TID) | INTRAMUSCULAR | Status: DC | PRN

## 2019-01-25 MED ORDER — MIDAZOLAM HCL 5 MG/5ML IJ SOLN
INTRAMUSCULAR | Status: DC | PRN
  Administered 2019-01-25: 2 mg via INTRAVENOUS

## 2019-01-25 MED ORDER — METHOCARBAMOL 500 MG PO TABS
500.0000 mg | ORAL_TABLET | Freq: Four times a day (QID) | ORAL | Status: DC | PRN
  Administered 2019-01-25 – 2019-01-26 (×3): 500 mg via ORAL
  Filled 2019-01-25 (×3): qty 1

## 2019-01-25 MED ORDER — DEXAMETHASONE SODIUM PHOSPHATE 10 MG/ML IJ SOLN
INTRAMUSCULAR | Status: DC | PRN
  Administered 2019-01-25: 10 mg via INTRAVENOUS

## 2019-01-25 MED ORDER — KETOROLAC TROMETHAMINE 30 MG/ML IJ SOLN
INTRAMUSCULAR | Status: DC | PRN
  Administered 2019-01-25: 30 mg via INTRA_ARTICULAR

## 2019-01-25 MED ORDER — SODIUM CHLORIDE (PF) 0.9 % IJ SOLN
INTRAMUSCULAR | Status: AC
  Filled 2019-01-25: qty 50

## 2019-01-25 MED ORDER — PROPOFOL 500 MG/50ML IV EMUL
INTRAVENOUS | Status: DC | PRN
  Administered 2019-01-25: 150 ug/kg/min via INTRAVENOUS

## 2019-01-25 MED ORDER — LACTATED RINGERS IV SOLN
INTRAVENOUS | Status: DC
  Administered 2019-01-25 (×3): via INTRAVENOUS

## 2019-01-25 MED ORDER — ISOPROPYL ALCOHOL 70 % SOLN
Status: AC
  Filled 2019-01-25: qty 480

## 2019-01-25 MED ORDER — PHENOL 1.4 % MT LIQD: 1.0000 | OROMUCOSAL | Status: DC | PRN

## 2019-01-25 MED ORDER — KETOROLAC TROMETHAMINE 30 MG/ML IJ SOLN
INTRAMUSCULAR | Status: AC
  Filled 2019-01-25: qty 1

## 2019-01-25 MED ORDER — CELECOXIB 200 MG PO CAPS
200.0000 mg | ORAL_CAPSULE | Freq: Two times a day (BID) | ORAL | Status: DC
  Administered 2019-01-25 – 2019-01-26 (×3): 200 mg via ORAL
  Filled 2019-01-25 (×3): qty 1

## 2019-01-25 MED ORDER — HYDROMORPHONE HCL 1 MG/ML IJ SOLN: 0.2500 mg | INTRAMUSCULAR | Status: DC | PRN

## 2019-01-25 MED ORDER — 0.9 % SODIUM CHLORIDE (POUR BTL) OPTIME
TOPICAL | Status: DC | PRN
  Administered 2019-01-25: 1000 mL

## 2019-01-25 MED ORDER — CEFAZOLIN SODIUM-DEXTROSE 2-4 GM/100ML-% IV SOLN
2.0000 g | Freq: Four times a day (QID) | INTRAVENOUS | Status: AC
  Administered 2019-01-25 (×2): 2 g via INTRAVENOUS
  Filled 2019-01-25 (×2): qty 100

## 2019-01-25 MED ORDER — LIDOCAINE 2% (20 MG/ML) 5 ML SYRINGE
INTRAMUSCULAR | Status: AC
  Filled 2019-01-25: qty 5

## 2019-01-25 MED ORDER — BUPIVACAINE IN DEXTROSE 0.75-8.25 % IT SOLN
INTRATHECAL | Status: DC | PRN
  Administered 2019-01-25: 2 mL via INTRATHECAL

## 2019-01-25 MED ORDER — POVIDONE-IODINE 10 % EX SWAB
2.0000 "application " | Freq: Once | CUTANEOUS | Status: AC
  Administered 2019-01-25: 2 via TOPICAL

## 2019-01-25 MED ORDER — POVIDONE-IODINE 10 % EX SWAB: 2.0000 "application " | Freq: Once | CUTANEOUS | Status: AC

## 2019-01-25 MED ORDER — EPHEDRINE 5 MG/ML INJ
INTRAVENOUS | Status: AC
  Filled 2019-01-25: qty 10

## 2019-01-25 MED ORDER — LISINOPRIL 20 MG PO TABS
40.0000 mg | ORAL_TABLET | Freq: Every day | ORAL | Status: DC
  Administered 2019-01-25: 22:00:00 40 mg via ORAL
  Filled 2019-01-25: qty 2

## 2019-01-25 MED ORDER — CHLORHEXIDINE GLUCONATE 4 % EX LIQD: 60.0000 mL | Freq: Once | CUTANEOUS | Status: DC

## 2019-01-25 MED ORDER — MIDAZOLAM HCL 2 MG/2ML IJ SOLN
INTRAMUSCULAR | Status: AC
  Filled 2019-01-25: qty 2

## 2019-01-25 MED ORDER — ACETAMINOPHEN 10 MG/ML IV SOLN: 1000.0000 mg | Freq: Once | INTRAVENOUS | Status: DC | PRN

## 2019-01-25 MED ORDER — FENTANYL CITRATE (PF) 100 MCG/2ML IJ SOLN
INTRAMUSCULAR | Status: DC | PRN
  Administered 2019-01-25: 100 ug via INTRAVENOUS

## 2019-01-25 MED ORDER — SENNA 8.6 MG PO TABS
1.0000 | ORAL_TABLET | Freq: Two times a day (BID) | ORAL | Status: DC
  Administered 2019-01-25 – 2019-01-26 (×2): 8.6 mg via ORAL
  Filled 2019-01-25 (×2): qty 1

## 2019-01-25 MED ORDER — WATER FOR IRRIGATION, STERILE IR SOLN
Status: DC | PRN
  Administered 2019-01-25: 2000 mL

## 2019-01-25 MED ORDER — PROMETHAZINE HCL 25 MG/ML IJ SOLN: 6.2500 mg | INTRAMUSCULAR | Status: DC | PRN

## 2019-01-25 MED ORDER — POLYETHYLENE GLYCOL 3350 17 G PO PACK: 17.0000 g | PACK | Freq: Every day | ORAL | Status: DC | PRN

## 2019-01-25 MED ORDER — SODIUM CHLORIDE (PF) 0.9 % IJ SOLN
INTRAMUSCULAR | Status: DC | PRN
  Administered 2019-01-25: 30 mL

## 2019-01-25 MED ORDER — FENTANYL CITRATE (PF) 100 MCG/2ML IJ SOLN
INTRAMUSCULAR | Status: AC
  Filled 2019-01-25: qty 2

## 2019-01-25 MED ORDER — ACETAMINOPHEN 10 MG/ML IV SOLN
1000.0000 mg | INTRAVENOUS | Status: AC
  Administered 2019-01-25: 1000 mg via INTRAVENOUS
  Filled 2019-01-25: qty 100

## 2019-01-25 MED ORDER — SODIUM CHLORIDE 0.9 % IR SOLN
Status: DC | PRN
  Administered 2019-01-25: 1000 mL
  Administered 2019-01-25: 3000 mL

## 2019-01-25 MED ORDER — EPHEDRINE SULFATE-NACL 50-0.9 MG/10ML-% IV SOSY
PREFILLED_SYRINGE | INTRAVENOUS | Status: DC | PRN
  Administered 2019-01-25 (×3): 10 mg via INTRAVENOUS

## 2019-01-25 MED ORDER — DOCUSATE SODIUM 100 MG PO CAPS
100.0000 mg | ORAL_CAPSULE | Freq: Two times a day (BID) | ORAL | Status: DC
  Administered 2019-01-25 – 2019-01-26 (×2): 100 mg via ORAL
  Filled 2019-01-25 (×2): qty 1

## 2019-01-25 MED ORDER — ZOLPIDEM TARTRATE 5 MG PO TABS: 5.0000 mg | ORAL_TABLET | Freq: Every evening | ORAL | Status: DC | PRN

## 2019-01-25 MED ORDER — ONDANSETRON HCL 4 MG/2ML IJ SOLN: 4.0000 mg | Freq: Four times a day (QID) | INTRAMUSCULAR | Status: DC | PRN

## 2019-01-25 MED ORDER — HYDROCHLOROTHIAZIDE 25 MG PO TABS
25.0000 mg | ORAL_TABLET | Freq: Every morning | ORAL | Status: DC
  Administered 2019-01-26: 08:00:00 25 mg via ORAL
  Filled 2019-01-25: qty 1

## 2019-01-25 MED ORDER — METOCLOPRAMIDE HCL 5 MG PO TABS: 5.0000 mg | ORAL_TABLET | Freq: Three times a day (TID) | ORAL | Status: DC | PRN

## 2019-01-25 MED ORDER — ACETAMINOPHEN 325 MG PO TABS: 325.0000 mg | ORAL_TABLET | Freq: Four times a day (QID) | ORAL | Status: DC | PRN

## 2019-01-25 MED ORDER — ONDANSETRON HCL 4 MG PO TABS: 4.0000 mg | ORAL_TABLET | Freq: Four times a day (QID) | ORAL | Status: DC | PRN

## 2019-01-25 MED ORDER — HYDROCODONE-ACETAMINOPHEN 5-325 MG PO TABS
1.0000 | ORAL_TABLET | ORAL | Status: DC | PRN
  Administered 2019-01-26: 2 via ORAL
  Filled 2019-01-25: qty 2

## 2019-01-25 MED ORDER — SODIUM CHLORIDE 0.9 % IV SOLN
INTRAVENOUS | Status: DC | PRN
  Administered 2019-01-25: 11:00:00 25 ug/min via INTRAVENOUS

## 2019-01-25 MED ORDER — DIPHENHYDRAMINE HCL 12.5 MG/5ML PO ELIX: 12.5000 mg | ORAL_SOLUTION | ORAL | Status: DC | PRN

## 2019-01-25 MED ORDER — BUPIVACAINE-EPINEPHRINE (PF) 0.25% -1:200000 IJ SOLN
INTRAMUSCULAR | Status: AC
  Filled 2019-01-25: qty 30

## 2019-01-25 MED ORDER — SODIUM CHLORIDE 0.9 % IV SOLN: INTRAVENOUS | Status: DC

## 2019-01-25 MED ORDER — MENTHOL 3 MG MT LOZG: 1.0000 | LOZENGE | OROMUCOSAL | Status: DC | PRN

## 2019-01-25 MED ORDER — DEXAMETHASONE SODIUM PHOSPHATE 10 MG/ML IJ SOLN
10.0000 mg | Freq: Once | INTRAMUSCULAR | Status: AC
  Administered 2019-01-26: 08:00:00 10 mg via INTRAVENOUS
  Filled 2019-01-25: qty 1

## 2019-01-25 MED ORDER — CEFAZOLIN SODIUM-DEXTROSE 2-4 GM/100ML-% IV SOLN
2.0000 g | INTRAVENOUS | Status: AC
  Administered 2019-01-25: 2 g via INTRAVENOUS
  Filled 2019-01-25: qty 100

## 2019-01-25 MED ORDER — ASPIRIN 81 MG PO CHEW
81.0000 mg | CHEWABLE_TABLET | Freq: Two times a day (BID) | ORAL | Status: DC
  Administered 2019-01-25 – 2019-01-26 (×2): 81 mg via ORAL
  Filled 2019-01-25 (×2): qty 1

## 2019-01-25 MED ORDER — ONDANSETRON HCL 4 MG/2ML IJ SOLN
INTRAMUSCULAR | Status: AC
  Filled 2019-01-25: qty 2

## 2019-01-25 MED ORDER — SODIUM CHLORIDE 0.9 % IV SOLN
INTRAVENOUS | Status: DC
  Administered 2019-01-25 (×2): via INTRAVENOUS

## 2019-01-25 MED ORDER — MORPHINE SULFATE (PF) 2 MG/ML IV SOLN
0.5000 mg | INTRAVENOUS | Status: DC | PRN
  Filled 2019-01-25: qty 1

## 2019-01-25 MED ORDER — HYDROCODONE-ACETAMINOPHEN 7.5-325 MG PO TABS
1.0000 | ORAL_TABLET | ORAL | Status: DC | PRN
  Administered 2019-01-25: 23:00:00 2 via ORAL
  Administered 2019-01-25: 1 via ORAL
  Administered 2019-01-25 – 2019-01-26 (×2): 2 via ORAL
  Filled 2019-01-25 (×3): qty 2
  Filled 2019-01-25: qty 1

## 2019-01-25 MED ORDER — TRANEXAMIC ACID-NACL 1000-0.7 MG/100ML-% IV SOLN
1000.0000 mg | INTRAVENOUS | Status: AC
  Administered 2019-01-25: 11:00:00 1000 mg via INTRAVENOUS
  Filled 2019-01-25: qty 100

## 2019-01-25 MED ORDER — BUPIVACAINE-EPINEPHRINE 0.25% -1:200000 IJ SOLN
INTRAMUSCULAR | Status: DC | PRN
  Administered 2019-01-25: 30 mL

## 2019-01-25 MED ORDER — CYCLOSPORINE 0.05 % OP EMUL
1.0000 [drp] | Freq: Two times a day (BID) | OPHTHALMIC | Status: DC
  Administered 2019-01-25 – 2019-01-26 (×2): 1 [drp] via OPHTHALMIC
  Filled 2019-01-25 (×2): qty 30

## 2019-01-25 MED ORDER — ISOPROPYL ALCOHOL 70 % SOLN
Status: DC | PRN
  Administered 2019-01-25: 1 via TOPICAL

## 2019-01-25 MED ORDER — DEXAMETHASONE SODIUM PHOSPHATE 10 MG/ML IJ SOLN
INTRAMUSCULAR | Status: AC
  Filled 2019-01-25: qty 1

## 2019-01-25 MED ORDER — ONDANSETRON HCL 4 MG/2ML IJ SOLN
INTRAMUSCULAR | Status: DC | PRN
  Administered 2019-01-25: 4 mg via INTRAVENOUS

## 2019-01-25 MED ORDER — ROSUVASTATIN CALCIUM 10 MG PO TABS
10.0000 mg | ORAL_TABLET | Freq: Every day | ORAL | Status: DC
  Administered 2019-01-25 – 2019-01-26 (×2): 10 mg via ORAL
  Filled 2019-01-25 (×2): qty 1

## 2019-01-25 SURGICAL SUPPLY — 58 items
ADH SKN CLS APL DERMABOND .7 (GAUZE/BANDAGES/DRESSINGS) ×2
APL PRP STRL LF DISP 70% ISPRP (MISCELLANEOUS) ×1
BAG DECANTER FOR FLEXI CONT (MISCELLANEOUS) IMPLANT
BAG SPEC THK2 15X12 ZIP CLS (MISCELLANEOUS)
BAG ZIPLOCK 12X15 (MISCELLANEOUS) IMPLANT
BLADE SURG SZ10 CARB STEEL (BLADE) IMPLANT
CHLORAPREP W/TINT 26 (MISCELLANEOUS) ×3 IMPLANT
COVER PERINEAL POST (MISCELLANEOUS) ×3 IMPLANT
COVER SURGICAL LIGHT HANDLE (MISCELLANEOUS) ×3 IMPLANT
COVER WAND RF STERILE (DRAPES) IMPLANT
DECANTER SPIKE VIAL GLASS SM (MISCELLANEOUS) ×3 IMPLANT
DERMABOND ADVANCED (GAUZE/BANDAGES/DRESSINGS) ×4
DERMABOND ADVANCED .7 DNX12 (GAUZE/BANDAGES/DRESSINGS) ×2 IMPLANT
DRAPE IMP U-DRAPE 54X76 (DRAPES) ×3 IMPLANT
DRAPE SHEET LG 3/4 BI-LAMINATE (DRAPES) ×9 IMPLANT
DRAPE U-SHAPE 47X51 STRL (DRAPES) ×6 IMPLANT
DRSG AQUACEL AG ADV 3.5X10 (GAUZE/BANDAGES/DRESSINGS) ×3 IMPLANT
ELECT PENCIL ROCKER SW 15FT (MISCELLANEOUS) ×3 IMPLANT
ELECT REM PT RETURN 15FT ADLT (MISCELLANEOUS) ×3 IMPLANT
GAUZE SPONGE 4X4 12PLY STRL (GAUZE/BANDAGES/DRESSINGS) ×3 IMPLANT
GLOVE BIO SURGEON STRL SZ8.5 (GLOVE) ×6 IMPLANT
GLOVE BIOGEL PI IND STRL 8.5 (GLOVE) ×1 IMPLANT
GLOVE BIOGEL PI INDICATOR 8.5 (GLOVE) ×2
GOWN SPEC L3 XXLG W/TWL (GOWN DISPOSABLE) ×3 IMPLANT
HANDPIECE INTERPULSE COAX TIP (DISPOSABLE) ×3
HEAD FEM 32MM DIA 3MM NECK (Head) ×2 IMPLANT
HIP SHELL ACETAB 3H 50MM (Hips) ×3 IMPLANT
HOLDER FOLEY CATH W/STRAP (MISCELLANEOUS) ×3 IMPLANT
HOOD PEEL AWAY FLYTE STAYCOOL (MISCELLANEOUS) ×12 IMPLANT
JET LAVAGE IRRISEPT WOUND (IRRIGATION / IRRIGATOR) ×3
KIT TURNOVER KIT A (KITS) IMPLANT
LAVAGE JET IRRISEPT WOUND (IRRIGATION / IRRIGATOR) ×1 IMPLANT
LINER G7 VIT E NTRL 32 SZD HIP (Miscellaneous) ×2 IMPLANT
MANIFOLD NEPTUNE II (INSTRUMENTS) ×3 IMPLANT
MARKER SKIN DUAL TIP RULER LAB (MISCELLANEOUS) ×3 IMPLANT
NDL SAFETY ECLIPSE 18X1.5 (NEEDLE) ×1 IMPLANT
NDL SPNL 18GX3.5 QUINCKE PK (NEEDLE) ×1 IMPLANT
NEEDLE HYPO 18GX1.5 SHARP (NEEDLE) ×3
NEEDLE SPNL 18GX3.5 QUINCKE PK (NEEDLE) ×3 IMPLANT
PACK ANTERIOR HIP CUSTOM (KITS) ×3 IMPLANT
SAW OSC TIP CART 19.5X105X1.3 (SAW) ×3 IMPLANT
SEALER BIPOLAR AQUA 6.0 (INSTRUMENTS) ×3 IMPLANT
SET HNDPC FAN SPRY TIP SCT (DISPOSABLE) ×1 IMPLANT
SHELL ACETAB HIP 3H 50MM (Hips) IMPLANT
STEM FEM CEMTL 133D 14X113X196 (Stem) ×2 IMPLANT
SUT ETHIBOND NAB CT1 #1 30IN (SUTURE) ×6 IMPLANT
SUT MNCRL AB 3-0 PS2 18 (SUTURE) ×3 IMPLANT
SUT MNCRL AB 4-0 PS2 18 (SUTURE) ×3 IMPLANT
SUT MON AB 2-0 CT1 36 (SUTURE) ×6 IMPLANT
SUT STRATAFIX PDO 1 14 VIOLET (SUTURE) ×3
SUT STRATFX PDO 1 14 VIOLET (SUTURE) ×1
SUT VIC AB 2-0 CT1 27 (SUTURE) ×3
SUT VIC AB 2-0 CT1 TAPERPNT 27 (SUTURE) ×1 IMPLANT
SUTURE STRATFX PDO 1 14 VIOLET (SUTURE) ×1 IMPLANT
SYR 3ML LL SCALE MARK (SYRINGE) ×3 IMPLANT
TRAY FOLEY MTR SLVR 16FR STAT (SET/KITS/TRAYS/PACK) IMPLANT
WATER STERILE IRR 1000ML POUR (IV SOLUTION) ×3 IMPLANT
YANKAUER SUCT BULB TIP 10FT TU (MISCELLANEOUS) ×3 IMPLANT

## 2019-01-25 NOTE — Anesthesia Postprocedure Evaluation (Signed)
Anesthesia Post Note  Patient: RAFFAELA LADLEY  Procedure(s) Performed: TOTAL HIP ARTHROPLASTY ANTERIOR APPROACH (Right Hip)     Patient location during evaluation: PACU Anesthesia Type: Spinal Level of consciousness: oriented and awake and alert Pain management: pain level controlled Vital Signs Assessment: post-procedure vital signs reviewed and stable Respiratory status: spontaneous breathing, respiratory function stable and patient connected to nasal cannula oxygen Cardiovascular status: blood pressure returned to baseline and stable Postop Assessment: no headache, no backache and no apparent nausea or vomiting Anesthetic complications: no    Last Vitals:  Vitals:   01/25/19 0852 01/25/19 1230  BP: 138/75 115/63  Pulse: 70 60  Resp: 16   Temp: 36.8 C 36.4 C  SpO2: 97% 94%    Last Pain:  Vitals:   01/25/19 1300  TempSrc:   PainSc: 0-No pain                 Kaysea Raya S

## 2019-01-25 NOTE — Anesthesia Procedure Notes (Signed)
Spinal  Patient location during procedure: OR End time: 01/25/2019 10:29 AM Staffing Resident/CRNA: Noralyn Pick D, CRNA Performed: anesthesiologist and resident/CRNA  Preanesthetic Checklist Completed: patient identified, site marked, surgical consent, pre-op evaluation, timeout performed, IV checked, risks and benefits discussed and monitors and equipment checked Spinal Block Patient position: sitting Prep: Betadine Patient monitoring: heart rate, continuous pulse ox and blood pressure Approach: midline Location: L2-3 Injection technique: single-shot Needle Needle type: Sprotte  Needle gauge: 24 G Needle length: 9 cm Assessment Sensory level: T6 Additional Notes Expiration date of kit checked and confirmed. Patient tolerated procedure well, without complications.

## 2019-01-25 NOTE — Transfer of Care (Signed)
Immediate Anesthesia Transfer of Care Note  Patient: Julie Cameron  Procedure(s) Performed: TOTAL HIP ARTHROPLASTY ANTERIOR APPROACH (Right Hip)  Patient Location: PACU  Anesthesia Type:Spinal  Level of Consciousness: awake and alert   Airway & Oxygen Therapy: Patient Spontanous Breathing and Patient connected to nasal cannula oxygen  Post-op Assessment: Report given to RN and Post -op Vital signs reviewed and stable  Post vital signs: Reviewed and stable  Last Vitals:  Vitals Value Taken Time  BP    Temp    Pulse 60 01/25/19 1230  Resp 14 01/25/19 1231  SpO2 94 % 01/25/19 1230  Vitals shown include unvalidated device data.  Last Pain:  Vitals:   01/25/19 0911  TempSrc:   PainSc: 3       Patients Stated Pain Goal: 4 (67/34/19 3790)  Complications: No apparent anesthesia complications

## 2019-01-25 NOTE — Plan of Care (Signed)
Plan of care for post op day 0 discussed with patient.  

## 2019-01-25 NOTE — Op Note (Signed)
OPERATIVE REPORT  SURGEON: Samson FredericBrian Gwendlyon Zumbro, MD   ASSISTANT: Skip MayerBlair Roberts, PA-C.  PREOPERATIVE DIAGNOSIS: Right hip avascular necrosis with collapse.   POSTOPERATIVE DIAGNOSIS: Right hip avascular necrosis with collapse.   PROCEDURE: Right total hip arthroplasty, anterior approach.   IMPLANTS: Biomet Taperloc Complete Microplasty stem, size 14 x 113, hi offset. Biomet G7 Cup, size 50 mm. Biomet E1 liner, size 32 mm, D, neutral. Biomet Biolox ceramic head ball, size 32 -3 mm.  ANESTHESIA:  Spinal  ESTIMATED BLOOD LOSS:-250 mL    ANTIBIOTICS: 2 g Ancef.  DRAINS: None.  COMPLICATIONS: None.   CONDITION: PACU - hemodynamically stable.   BRIEF CLINICAL NOTE: Blenda BridegroomJanet M Herzig is a 63 y.o. female with a long-standing history of Right hip avascular necrosis with collapse. After failing conservative management, the patient was indicated for total hip arthroplasty. The risks, benefits, and alternatives to the procedure were explained, and the patient elected to proceed.  PROCEDURE IN DETAIL: Surgical site was marked by myself in the pre-op holding area. Once inside the operating room, spinal anesthesia was obtained, and a foley catheter was inserted. The patient was then positioned on the Hana table.  All bony prominences were well padded.  The hip was prepped and draped in the normal sterile surgical fashion.  A time-out was called verifying side and site of surgery. The patient received IV antibiotics within 60 minutes of beginning the procedure.   The direct anterior approach to the hip was performed through the Hueter interval.  Lateral femoral circumflex vessels were treated with the Auqumantys. The anterior capsule was exposed and an inverted T capsulotomy was made. The femoral neck cut was made to the level of the templated cut.  A corkscrew was placed into the head and the head was removed.  The femoral head was found to have eburnated bone. The head was passed to the back table and  was measured.   Acetabular exposure was achieved, and the pulvinar and labrum were excised. Sequential reaming of the acetabulum was then performed up to a size 49 mm reamer. A 50 mm cup was then opened and impacted into place at approximately 40 degrees of abduction and 20 degrees of anteversion. The final polyethylene liner was impacted into place and acetabular osteophytes were removed.    I then gained femoral exposure taking care to protect the abductors and greater trochanter.  This was performed using standard external rotation, extension, and adduction.  The capsule was peeled off the inner aspect of the greater trochanter, taking care to preserve the short external rotators. A cookie cutter was used to enter the femoral canal, and then the femoral canal finder was placed.  Sequential broaching was performed up to a size 14.  Calcar planer was used on the femoral neck remnant.  I placed a hi offset neck and a trial head ball.  The hip was reduced.  Leg lengths and offset were checked fluoroscopically.  The hip was dislocated and trial components were removed.  The final implants were placed, and the hip was reduced.  Fluoroscopy was used to confirm component position and leg lengths.  At 90 degrees of external rotation and full extension, the hip was stable to an anterior directed force.   The wound was copiously irrigated with Irrisept solution and normal saline using pule lavage.  Marcaine solution was injected into the periarticular soft tissue.  The wound was closed in layers using #1 Vicryl and V-Loc for the fascia, 2-0 Vicryl for the subcutaneous fat, 2-0  Monocryl for the deep dermal layer, 3-0 running Monocryl subcuticular stitch, and Dermabond for the skin.  Once the glue was fully dried, an Aquacell Ag dressing was applied.  The patient was transported to the recovery room in stable condition.  Sponge, needle, and instrument counts were correct at the end of the case x2.  The patient tolerated  the procedure well and there were no known complications.  Please note that a surgical assistant was a medical necessity for this procedure to perform it in a safe and expeditious manner. Assistant was necessary to provide appropriate retraction of vital neurovascular structures, to prevent femoral fracture, and to allow for anatomic placement of the prosthesis.

## 2019-01-25 NOTE — Evaluation (Addendum)
Physical Therapy Evaluation Patient Details Name: Julie Cameron MRN: [id number] DOB: 05-30-1956 Today's Date: 01/25/2019   History of Present Illness  63 yo female s/p R DA-THA on 01/25/19. PMH includes L THA 2018, AVN, anxiety, OA, depression, HLD, HTN.  Clinical Impression   Pt presents with moderate R hip pain, post-operative R hip weakness, increased time and effort to perform mobility tasks ,and decreased activity tolerance. Pt to benefit from acute PT to address deficits. Pt ambulated 100 ft with RW with min guard assist, verbal cuing for form and safety provided throughout. Pt educated on ankle pumps (20/hour) to perform this afternoon/evening to increase circulation, to pt's tolerance and limited by pain. PT to progress mobility as tolerated, and will continue to follow acutely.        Follow Up Recommendations Follow surgeon's recommendation for DC plan and follow-up therapies;Supervision for mobility/OOB(HEP)    Equipment Recommendations  None recommended by PT    Recommendations for Other Services       Precautions / Restrictions Precautions Precautions: Fall Restrictions Weight Bearing Restrictions: No Other Position/Activity Restrictions: WBAT      Mobility  Bed Mobility Overal bed mobility: Needs Assistance Bed Mobility: Supine to Sit     Supine to sit: Min guard;HOB elevated     General bed mobility comments: Min guard for safety, verbal cuing for sequencing. Increased time and effort.  Transfers Overall transfer level: Needs assistance Equipment used: Rolling walker (2 wheeled) Transfers: Sit to/from Stand Sit to Stand: Min guard         General transfer comment: Min guard for safety. Verbal cuing for hand placement when rising.  Ambulation/Gait Ambulation/Gait assistance: Min guard;+2 safety/equipment Gait Distance (Feet): 100 Feet Assistive device: Rolling walker (2 wheeled) Gait Pattern/deviations: Step-to pattern;Step-through pattern;Decreased  stride length Gait velocity: decr   General Gait Details: Min guard for safety, verbal cuing for sequencing and placement in RW.  Stairs            Wheelchair Mobility    Modified Rankin (Stroke Patients Only)       Balance Overall balance assessment: Mild deficits observed, not formally tested                                           Pertinent Vitals/Pain Pain Assessment: 0-10 Pain Score: 4  Pain Location: R hip Pain Descriptors / Indicators: Sore Pain Intervention(s): Limited activity within patient's tolerance;Monitored during session;Repositioned    Home Living Family/patient expects to be discharged to:: Private residence Living Arrangements: Children(son lives with her) Available Help at Discharge: Family;Available 24 hours/day Type of Home: House Home Access: Stairs to enter Entrance Stairs-Rails: Psychiatric nurse of Steps: 3 Home Layout: One level Home Equipment: Walker - 2 wheels;Cane - single point;Bedside commode      Prior Function Level of Independence: Independent with assistive device(s)         Comments: Pt used cane for ambulation PTA     Hand Dominance   Dominant Hand: Right    Extremity/Trunk Assessment   Upper Extremity Assessment Upper Extremity Assessment: Overall WFL for tasks assessed    Lower Extremity Assessment Lower Extremity Assessment: Overall WFL for tasks assessed;RLE deficits/detail RLE Deficits / Details: suspected post-surgical hip weakness; able to perform ankle pumps, quad set, heel slide, SLR with lift assist due to pain RLE Sensation: WNL    Cervical / Trunk Assessment Cervical /  Trunk Assessment: Normal  Communication   Communication: No difficulties  Cognition Arousal/Alertness: Awake/alert Behavior During Therapy: WFL for tasks assessed/performed Overall Cognitive Status: Within Functional Limits for tasks assessed                                         General Comments      Exercises     Assessment/Plan    PT Assessment Patient needs continued PT services  PT Problem List Decreased mobility;Decreased strength;Decreased range of motion;Decreased activity tolerance;Decreased balance;Decreased knowledge of use of DME;Pain       PT Treatment Interventions DME instruction;Therapeutic activities;Gait training;Therapeutic exercise;Patient/family education;Balance training;Stair training;Functional mobility training    PT Goals (Current goals can be found in the Care Plan section)  Acute Rehab PT Goals Patient Stated Goal: go home tomorrow am PT Goal Formulation: With patient Time For Goal Achievement: 02/01/19 Potential to Achieve Goals: Good    Frequency 7X/week   Barriers to discharge        Co-evaluation               AM-PAC PT "6 Clicks" Mobility  Outcome Measure Help needed turning from your back to your side while in a flat bed without using bedrails?: A Little Help needed moving from lying on your back to sitting on the side of a flat bed without using bedrails?: A Little Help needed moving to and from a bed to a chair (including a wheelchair)?: A Little Help needed standing up from a chair using your arms (e.g., wheelchair or bedside chair)?: A Little Help needed to walk in hospital room?: A Little Help needed climbing 3-5 steps with a railing? : A Little 6 Click Score: 18    End of Session Equipment Utilized During Treatment: Gait belt Activity Tolerance: Patient tolerated treatment well Patient left: in chair;with call bell/phone within reach;with chair alarm set;with SCD's reapplied Nurse Communication: Mobility status PT Visit Diagnosis: Difficulty in walking, not elsewhere classified (R26.2);Other abnormalities of gait and mobility (R26.89)    Time: [id number] PT Time Calculation (min) (ACUTE ONLY): 11 min   Charges:   PT Evaluation $PT Eval Low Complexity: 1 Low          Nicola PoliceAlexa D Jamarcus Laduke,  PT Acute Rehabilitation Services Pager [phone]  Office [phone]   Romona Murdy D Despina Hiddenure 01/25/2019, 5:34 PM

## 2019-01-25 NOTE — Discharge Instructions (Signed)
°Dr. Sima Lindenberger °Joint Replacement Specialist °[facility] °[address] °[place], [place] [address] °[phone] ° ° °TOTAL HIP REPLACEMENT POSTOPERATIVE DIRECTIONS ° ° ° °Hip Rehabilitation, Guidelines Following Surgery  ° °WEIGHT BEARING °Weight bearing as tolerated with assist device (walker, cane, etc) as directed, use it as long as suggested by your surgeon or therapist, typically at least 4-6 weeks. ° °The results of a hip operation are greatly improved after range of motion and muscle strengthening exercises. Follow all safety measures which are given to protect your hip. If any of these exercises cause increased pain or swelling in your joint, decrease the amount until you are comfortable again. Then slowly increase the exercises. Call your caregiver if you have problems or questions.  ° °HOME CARE INSTRUCTIONS  °Most of the following instructions are designed to prevent the dislocation of your new hip.  °Remove items at home which could result in a fall. This includes throw rugs or furniture in walking pathways.  °Continue medications as instructed at time of discharge. °· You may have some home medications which will be placed on hold until you complete the course of blood thinner medication. °· You may start showering once you are discharged home. Do not remove your dressing. °Do not put on socks or shoes without following the instructions of your caregivers.   °Sit on chairs with arms. Use the chair arms to help push yourself up when arising.  °Arrange for the use of a toilet seat elevator so you are not sitting low.  °· Walk with walker as instructed.  °You may resume a sexual relationship in one month or when given the OK by your caregiver.  °Use walker as long as suggested by your caregivers.  °You may put full weight on your legs and walk as much as is comfortable. °Avoid periods of inactivity such as sitting longer than an hour when not asleep. This helps prevent  blood clots.  °You may return to work once you are cleared by your surgeon.  °Do not drive a car for 6 weeks or until released by your surgeon.  °Do not drive while taking narcotics.  °Wear elastic stockings for two weeks following surgery during the day but you may remove then at night.  °Make sure you keep all of your appointments after your operation with all of your doctors and caregivers. You should call the office at the above phone number and make an appointment for approximately two weeks after the date of your surgery. °Please pick up a stool softener and laxative for home use as long as you are requiring pain medications. °· ICE to the affected hip every three hours for 30 minutes at a time and then as needed for pain and swelling. Continue to use ice on the hip for pain and swelling from surgery. You may notice swelling that will progress down to the foot and ankle.  This is normal after surgery.  Elevate the leg when you are not up walking on it.   °It is important for you to complete the blood thinner medication as prescribed by your doctor. °· Continue to use the breathing machine which will help keep your temperature down.  It is common for your temperature to cycle up and down following surgery, especially at night when you are not up moving around and exerting yourself.  The breathing machine keeps your lungs expanded and your temperature down. ° °RANGE OF MOTION AND STRENGTHENING EXERCISES  °These exercises are   designed to help you keep full movement of your hip joint. Follow your caregiver's or physical therapist's instructions. Perform all exercises about fifteen times, three times per day or as directed. Exercise both hips, even if you have had only one joint replacement. These exercises can be done on a training (exercise) mat, on the floor, on a table or on a bed. Use whatever works the best and is most comfortable for you. Use music or television while you are exercising so that the exercises  are a pleasant break in your day. This will make your life better with the exercises acting as a break in routine you can look forward to.  °Lying on your back, slowly slide your foot toward your buttocks, raising your knee up off the floor. Then slowly slide your foot back down until your leg is straight again.  °Lying on your back spread your legs as far apart as you can without causing discomfort.  °Lying on your side, raise your upper leg and foot straight up from the floor as far as is comfortable. Slowly lower the leg and repeat.  °Lying on your back, tighten up the muscle in the front of your thigh (quadriceps muscles). You can do this by keeping your leg straight and trying to raise your heel off the floor. This helps strengthen the largest muscle supporting your knee.  °Lying on your back, tighten up the muscles of your buttocks both with the legs straight and with the knee bent at a comfortable angle while keeping your heel on the floor.  ° °SKILLED REHAB INSTRUCTIONS: °If the patient is transferred to a skilled rehab facility following release from the hospital, a list of the current medications will be sent to the facility for the patient to continue.  When discharged from the skilled rehab facility, please have the facility set up the patient's Home Health Physical Therapy prior to being released. Also, the skilled facility will be responsible for providing the patient with their medications at time of release from the facility to include their pain medication and their blood thinner medication. If the patient is still at the rehab facility at time of the two week follow up appointment, the skilled rehab facility will also need to assist the patient in arranging follow up appointment in our office and any transportation needs. ° °MAKE SURE YOU:  °Understand these instructions.  °Will watch your condition.  °Will get help right away if you are not doing well or get worse. ° °Pick up stool softner and  laxative for home use following surgery while on pain medications. °Do not remove your dressing. °The dressing is waterproof--it is OK to take showers. °Continue to use ice for pain and swelling after surgery. °Do not use any lotions or creams on the incision until instructed by your surgeon. °Total Hip Protocol. ° ° °

## 2019-01-25 NOTE — Interval H&P Note (Signed)
History and Physical Interval Note:  01/25/2019 10:07 AM  Julie Cameron  has presented today for surgery, with the diagnosis of avascular necrosis of bone of hip.  The various methods of treatment have been discussed with the patient and family. After consideration of risks, benefits and other options for treatment, the patient has consented to  Procedure(s): TOTAL HIP ARTHROPLASTY ANTERIOR APPROACH (Right) as a surgical intervention.  The patient's history has been reviewed, patient examined, no change in status, stable for surgery.  I have reviewed the patient's chart and labs.  Questions were answered to the patient's satisfaction.     Hilton Cork Giovany Cosby

## 2019-01-26 LAB — BASIC METABOLIC PANEL
Anion gap: 10 (ref 5–15)
BUN: 18 mg/dL (ref 8–23)
CO2: 24 mmol/L (ref 22–32)
Calcium: 8.5 mg/dL — ABNORMAL LOW (ref 8.9–10.3)
Chloride: 104 mmol/L (ref 98–111)
Creatinine, Ser: 0.86 mg/dL (ref 0.44–1.00)
GFR calc Af Amer: >60 mL/min (ref >60)
GFR calc non Af Amer: >60 mL/min (ref >60)
Glucose, Bld: 170 mg/dL — ABNORMAL HIGH (ref 70–99)
Potassium: 4.4 mmol/L (ref 3.5–5.1)
Sodium: 138 mmol/L (ref 135–145)

## 2019-01-26 LAB — CBC
HCT: 33.4 % — ABNORMAL LOW (ref 36.0–46.0)
Hemoglobin: 10.9 g/dL — ABNORMAL LOW (ref 12.0–15.0)
MCH: 32.5 pg (ref 26.0–34.0)
MCHC: 32.6 g/dL (ref 30.0–36.0)
MCV: 99.7 fL (ref 80.0–100.0)
Platelets: 192 10*3/uL (ref 150–400)
RBC: 3.35 MIL/uL — ABNORMAL LOW (ref 3.87–5.11)
RDW: 11.9 % (ref 11.5–15.5)
WBC: 9.6 10*3/uL (ref 4.0–10.5)
nRBC: 0 % (ref 0.0–0.2)

## 2019-01-26 MED ORDER — ONDANSETRON HCL 4 MG PO TABS: 4.0000 mg | ORAL_TABLET | Freq: Four times a day (QID) | ORAL | 0 refills | Status: AC | PRN

## 2019-01-26 MED ORDER — ASPIRIN 81 MG PO CHEW: 81.0000 mg | CHEWABLE_TABLET | Freq: Two times a day (BID) | ORAL | 1 refills | Status: AC

## 2019-01-26 MED ORDER — HYDROCODONE-ACETAMINOPHEN 5-325 MG PO TABS: 1.0000 | ORAL_TABLET | ORAL | 0 refills | Status: AC | PRN

## 2019-01-26 NOTE — Progress Notes (Signed)
Physical Therapy Treatment Patient Details Name: Julie Cameron MRN: [id number] DOB: May 12, 1956 Today's Date: 01/26/2019    History of Present Illness Pt is a 63 y/o female s/p R THA-direct anterior on 01/25/19. PMH includes L THA 2018, AVN, anxiety, OA, depression, HLD, HTN.    PT Comments    Pt ambulated in hallway and practiced safe stair technique.  Pt performing well and ready for d/c home today.  Pt had no further questions.    Follow Up Recommendations  Supervision for mobility/OOB;Follow surgeon's recommendation for DC plan and follow-up therapies     Equipment Recommendations  None recommended by PT    Recommendations for Other Services       Precautions / Restrictions Precautions Precautions: Fall Restrictions Weight Bearing Restrictions: Yes RLE Weight Bearing: Weight bearing as tolerated Other Position/Activity Restrictions: WBAT    Mobility  Bed Mobility Overal bed mobility: Needs Assistance Bed Mobility: Supine to Sit     Supine to sit: Supervision        Transfers Overall transfer level: Needs assistance Equipment used: Rolling walker (2 wheeled) Transfers: Sit to/from Stand Sit to Stand: Supervision         General transfer comment: cues for safety  Ambulation/Gait Ambulation/Gait assistance: Min guard;Supervision Gait Distance (Feet): 200 Feet Assistive device: Rolling walker (2 wheeled) Gait Pattern/deviations: Step-through pattern;Decreased stride length;Antalgic;Decreased stance time - right     General Gait Details: verbal cues for step length, RW positioning, posture   Stairs Stairs: Yes Stairs assistance: Min guard Stair Management: Step to pattern;Forwards;One rail Right Number of Stairs: 2 General stair comments: verbal cues for safety and sequence, performed with 2 rails and then one rail, pt reports understanding   Wheelchair Mobility    Modified Rankin (Stroke Patients Only)       Balance                                             Cognition Arousal/Alertness: Awake/alert Behavior During Therapy: WFL for tasks assessed/performed Overall Cognitive Status: Within Functional Limits for tasks assessed                                        Exercises    General Comments        Pertinent Vitals/Pain Pain Assessment: 0-10 Pain Score: 4  Faces Pain Scale: Hurts a little bit Pain Location: R hip Pain Descriptors / Indicators: Sore;Tightness Pain Intervention(s): Limited activity within patient's tolerance;Monitored during session    Home Living                      Prior Function            PT Goals (current goals can now be found in the care plan section) Acute Rehab PT Goals PT Goal Formulation: With patient Time For Goal Achievement: 02/01/19 Potential to Achieve Goals: Good Progress towards PT goals: Progressing toward goals    Frequency    7X/week      PT Plan Current plan remains appropriate    Co-evaluation              AM-PAC PT "6 Clicks" Mobility   Outcome Measure  Help needed turning from your back to your side while in a flat bed without using bedrails?:  A Little Help needed moving from lying on your back to sitting on the side of a flat bed without using bedrails?: A Little Help needed moving to and from a bed to a chair (including a wheelchair)?: A Little Help needed standing up from a chair using your arms (e.g., wheelchair or bedside chair)?: A Little Help needed to walk in hospital room?: A Little Help needed climbing 3-5 steps with a railing? : A Little 6 Click Score: 18    End of Session Equipment Utilized During Treatment: Gait belt Activity Tolerance: Patient tolerated treatment well Patient left: in chair;with call bell/phone within reach Nurse Communication: Mobility status PT Visit Diagnosis: Difficulty in walking, not elsewhere classified (R26.2);Other abnormalities of gait and mobility (R26.89)      Time: [id number] PT Time Calculation (min) (ACUTE ONLY): 10 min  Charges:  $Gait Training: 8-22 mins           Zenovia JarredKati Akia Desroches, PT, DPT Acute Rehabilitation Services Office: [phone] Pager: [phone]  Sarajane JewsLEMYRE,KATHrine E 01/26/2019, 12:08 PM

## 2019-01-26 NOTE — Progress Notes (Signed)
Physical Therapy Progress Note  Clinical Impression: Pt seen for LE strengthening therex. PT provided HEP handout and reviewed in full with demonstration for progression into standing. Pt tolerated exercises well with cueing and demonstration. PT provided pt education re: ice, generalized walking program and car transfers. Pt would continue to benefit from skilled physical therapy services at this time while admitted and after d/c to address the below listed limitations in order to improve overall safety and independence with functional mobility.  Sherie Don, [place], DPT  Acute Rehabilitation Services Pager [phone] Office [phone]     01/26/19 0900  PT Visit Information  Last PT Received On 01/26/19  Assistance Needed +1  History of Present Illness Pt is a 63 y/o female s/p R THA-direct anterior on 01/25/19. PMH includes L THA 2018, AVN, anxiety, OA, depression, HLD, HTN.  Precautions  Precautions Fall  Restrictions  Weight Bearing Restrictions Yes  RLE Weight Bearing WBAT  Pain Assessment  Pain Assessment Faces  Faces Pain Scale 2  Pain Location R hip  Pain Descriptors / Indicators Sore  Pain Intervention(s) Monitored during session;Repositioned;Premedicated before session  Cognition  Arousal/Alertness Awake/alert  Behavior During Therapy WFL for tasks assessed/performed  Overall Cognitive Status Within Functional Limits for tasks assessed  Exercises  Exercises Total Joint  Total Joint Exercises  Ankle Circles/Pumps AROM;Both;10 reps;Supine  Quad Sets AROM;Strengthening;Right;10 reps;Supine  Gluteal Sets AROM;Strengthening;10 reps;Supine  Short Arc Quad AROM;Strengthening;Right;10 reps;Supine  Heel Slides AAROM;Right;10 reps;Supine  Hip ABduction/ADduction AAROM;Right;10 reps;Supine  PT - End of Session  Activity Tolerance Patient tolerated treatment well  Patient left in bed;with call bell/phone within reach  Nurse Communication Mobility status   PT -  Assessment/Plan  PT Plan Current plan remains appropriate  PT Visit Diagnosis Difficulty in walking, not elsewhere classified (R26.2);Other abnormalities of gait and mobility (R26.89)  PT Frequency (ACUTE ONLY) 7X/week  Follow Up Recommendations Supervision for mobility/OOB (home with HEP)  PT equipment None recommended by PT  AM-PAC PT "6 Clicks" Mobility Outcome Measure (Version 2)  Help needed turning from your back to your side while in a flat bed without using bedrails? 3  Help needed moving from lying on your back to sitting on the side of a flat bed without using bedrails? 3  Help needed moving to and from a bed to a chair (including a wheelchair)? 3  Help needed standing up from a chair using your arms (e.g., wheelchair or bedside chair)? 3  Help needed to walk in hospital room? 3  Help needed climbing 3-5 steps with a railing?  3  6 Click Score 18  Consider Recommendation of Discharge To: Home with [facility]  PT Goal Progression  Progress towards PT goals Progressing toward goals  Acute Rehab PT Goals  PT Goal Formulation With patient  Time For Goal Achievement 02/01/19  Potential to Achieve Goals Good  PT Time Calculation  PT Start Time (ACUTE ONLY) 0837  PT Stop Time (ACUTE ONLY) 0849  PT Time Calculation (min) (ACUTE ONLY) 12 min  PT General Charges  $$ ACUTE PT VISIT 1 Visit  PT Treatments  $Therapeutic Exercise 8-22 mins

## 2019-01-26 NOTE — Progress Notes (Signed)
    Subjective:  Patient reports pain as mild.  Denies N/V/CP/SOB. No c/o.  Objective:   VITALS:   Vitals:   01/25/19 2043 01/26/19 0114 01/26/19 0449 01/26/19 1002  BP: 123/80 111/73 103/73 103/63  Pulse: (!) 55 (!) 59 (!) 58 60  Resp: 16 16 16 16   Temp: 98.3 F (36.8 C) 98.3 F (36.8 C) 97.9 F (36.6 C) 98.2 F (36.8 C)  TempSrc: Oral Oral Oral Oral  SpO2: 96% 98% 98% 96%  Weight:      Height:        NAD ABD soft Sensation intact distally Intact pulses distally Dorsiflexion/Plantar flexion intact Incision: dressing C/D/I Compartment soft   Lab Results  Component Value Date   WBC 9.6 01/26/2019   HGB 10.9 (L) 01/26/2019   HCT 33.4 (L) 01/26/2019   MCV 99.7 01/26/2019   PLT 192 01/26/2019   BMET    Component Value Date/Time   NA 138 01/26/2019 0248   K 4.4 01/26/2019 0248   CL 104 01/26/2019 0248   CO2 24 01/26/2019 0248   GLUCOSE 170 (H) 01/26/2019 0248   BUN 18 01/26/2019 0248   CREATININE 0.86 01/26/2019 0248   CALCIUM 8.5 (L) 01/26/2019 0248   GFRNONAA >60 01/26/2019 0248   GFRAA >60 01/26/2019 0248     Assessment/Plan: 1 Day Post-Op   Principal Problem:   Avascular necrosis of hip, right (HCC)   WBAT with walker DVT ppx: Aspirin, SCDs, TEDS PO pain control PT/OT Dispo: D/C home with HEP   Julie Cameron 01/26/2019, 10:16 AM   Rod Can, MD Cell: [phone] [facility]  [place] [address], [address], [place], [place] [address] Phone: [phone] [email or web address] Facebook  [employer]

## 2019-01-26 NOTE — Discharge Summary (Signed)
Physician Discharge Summary  Patient ID: Blenda BridegroomJanet M Joslin MRN: [id number] DOB/AGE: August 13, 1955 63 y.o.  Admit date: 01/25/2019 Discharge date: 01/26/2019  Admission Diagnoses:  Avascular necrosis of hip, right [facility](HCC)  Discharge Diagnoses:  Principal Problem:   Avascular necrosis of hip, right [facility](HCC)   Past Medical History:  Diagnosis Date  . Acid reflux   . Anxiety   . Arthritis   . Cellulitis   . Depression   . Hyperlipemia   . Hypertension   . Renal disorder    Dr. Deuturding  . Wears glasses     Surgeries: Procedure(s): TOTAL HIP ARTHROPLASTY ANTERIOR APPROACH on 01/25/2019   Consultants (if any):   Discharged Condition: Improved  Hospital Course: Blenda BridegroomJanet M Engen is an 63 y.o. female who was admitted 01/25/2019 with a diagnosis of Avascular necrosis of hip, right (HCC) and went to the operating room on 01/25/2019 and underwent the above named procedures.    She was given perioperative antibiotics:  Anti-infectives (From admission, onward)   Start     Dose/Rate Route Frequency Ordered Stop   01/25/19 1600  ceFAZolin (ANCEF) IVPB 2g/100 mL premix     2 g 200 mL/hr over 30 Minutes Intravenous Every 6 hours 01/25/19 1347 01/25/19 2249   01/25/19 0900  ceFAZolin (ANCEF) IVPB 2g/100 mL premix     2 g 200 mL/hr over 30 Minutes Intravenous On call to O.R. 01/25/19 [id number] 01/25/19 1032    .  She was given sequential compression devices, early ambulation, and ASA for DVT prophylaxis.  She benefited maximally from the hospital stay and there were no complications.    Recent vital signs:  Vitals:   01/26/19 0449 01/26/19 1002  BP: 103/73 103/63  Pulse: (!) 58 60  Resp: 16 16  Temp: 97.9 F (36.6 C) 98.2 F (36.8 C)  SpO2: 98% 96%    Recent laboratory studies:  Lab Results  Component Value Date   HGB 10.9 (L) 01/26/2019   HGB 14.2 01/23/2019   HGB 10.8 (L) 04/27/2017   Lab Results  Component Value Date   WBC 9.6 01/26/2019   PLT 192 01/26/2019   Lab Results   Component Value Date   INR 1.0 01/23/2019   Lab Results  Component Value Date   NA 138 01/26/2019   K 4.4 01/26/2019   CL 104 01/26/2019   CO2 24 01/26/2019   BUN 18 01/26/2019   CREATININE 0.86 01/26/2019   GLUCOSE 170 (H) 01/26/2019    Discharge Medications:   Allergies as of 01/26/2019   No Known Allergies     Medication List    TAKE these medications   aspirin 81 MG chewable tablet Chew 1 tablet (81 mg total) by mouth 2 (two) times daily.   buPROPion 300 MG 24 hr tablet Commonly known as: WELLBUTRIN XL Take 300 mg by mouth daily.   cycloSPORINE 0.05 % ophthalmic emulsion Commonly known as: RESTASIS Place 1 drop into both eyes 2 (two) times daily.   docusate sodium 100 MG capsule Commonly known as: COLACE Take 1 capsule (100 mg total) by mouth 2 (two) times daily.   escitalopram 20 MG tablet Commonly known as: LEXAPRO Take 20 mg by mouth daily.   Eszopiclone 3 MG Tabs Take 3 mg by mouth at bedtime. Take immediately before bedtime   hydrochlorothiazide 25 MG tablet Commonly known as: HYDRODIURIL Take 25 mg by mouth every morning.   HYDROcodone-acetaminophen 5-325 MG tablet Commonly known as: NORCO/VICODIN Take 1-2 tablets by mouth every 4 (four)  hours as needed for moderate pain (pain score 4-6). What changed:   when to take this  reasons to take this   ibuprofen 200 MG tablet Commonly known as: ADVIL Take 200-400 mg by mouth every 6 (six) hours as needed for headache or moderate pain.   lisinopril 40 MG tablet Commonly known as: ZESTRIL Take 40 mg by mouth at bedtime.   ondansetron 4 MG tablet Commonly known as: ZOFRAN Take 1 tablet (4 mg total) by mouth every 6 (six) hours as needed for nausea.   rosuvastatin 10 MG tablet Commonly known as: CRESTOR Take 10 mg by mouth daily.   senna 8.6 MG Tabs tablet Commonly known as: SENOKOT Take 2 tablets (17.2 mg total) by mouth at bedtime.       Diagnostic Studies: Dg Pelvis Portable  Result  Date: 01/25/2019 CLINICAL DATA:  Status post right hip arthroplasty EXAM: PORTABLE PELVIS 1-2 VIEWS COMPARISON:  04/26/2017 FINDINGS: Status post right total hip arthroplasty. Arthroplasty components are in their expected alignment without periprosthetic lucency or fracture. Expected postoperative changes within the adjacent soft tissues. Left total hip arthroplasty is also present, grossly intact. Mild bilateral SI joint OA. IMPRESSION: Interval postsurgical changes from total right hip arthroplasty without evidence of immediate postoperative complication. Electronically Signed   By: Davina Poke M.D.   On: 01/25/2019 13:47   Dg C-arm 1-60 Min-no Report  Result Date: 01/25/2019 Fluoroscopy was utilized by the requesting physician.  No radiographic interpretation.   Dg Hip Operative Unilat W Or W/o Pelvis Right  Result Date: 01/25/2019 CLINICAL DATA:  Imaging for right hip arthroplasty. EXAM: OPERATIVE RIGHT HIP (WITH PELVIS IF PERFORMED) 2 VIEWS TECHNIQUE: Fluoroscopic spot image(s) were submitted for interpretation post-operatively. COMPARISON:  04/26/2017 FINDINGS: Total right hip arthroplasty appears well seated and aligned on the submitted images. No acute fracture or evidence of an operative complication. IMPRESSION: Well-positioned total right hip arthroplasty. Electronically Signed   By: Lajean Manes M.D.   On: 01/25/2019 13:50    Disposition: Discharge disposition: 01-Home or Self Care       Discharge Instructions    Call MD / Call 911   Complete by: As directed    If you experience chest pain or shortness of breath, CALL 911 and be transported to the hospital emergency room.  If you develope a fever above 101 F, pus (white drainage) or increased drainage or redness at the wound, or calf pain, call your surgeon's office.   Constipation Prevention   Complete by: As directed    Drink plenty of fluids.  Prune juice may be helpful.  You may use a stool softener, such as Colace (over  the counter) 100 mg twice a day.  Use MiraLax (over the counter) for constipation as needed.   Diet - low sodium heart healthy   Complete by: As directed    Driving restrictions   Complete by: As directed    No driving for 2 weeks   Increase activity slowly as tolerated   Complete by: As directed    Lifting restrictions   Complete by: As directed    No lifting for 6 weeks      Follow-up Information    Quentin Shorey, Aaron Edelman, MD. Schedule an appointment as soon as possible for a visit in 2 weeks.   Specialty: Orthopedic Surgery Why: For wound re-check Contact information: [address] [place] [address] [phone]            Signed: Hilton Cork Demosthenes Virnig 01/26/2019,  10:20 AM

## 2019-01-30 ENCOUNTER — Encounter (HOSPITAL_COMMUNITY): Payer: Self-pay | Admitting: Orthopedic Surgery

## 2019-07-20 ENCOUNTER — Other Ambulatory Visit: Payer: Self-pay

## 2019-07-20 ENCOUNTER — Ambulatory Visit: Payer: Commercial Managed Care - PPO | Attending: Internal Medicine

## 2019-07-20 DIAGNOSIS — Z20822 Contact with and (suspected) exposure to covid-19: Secondary | ICD-10-CM

## 2019-07-21 LAB — NOVEL CORONAVIRUS, NAA: SARS-CoV-2, NAA: NOT DETECTED

## 2019-08-11 IMAGING — DX PORTABLE PELVIS 1-2 VIEWS
1 series · 1 of 1 positions shown · non-contrast
Comparison: 04/26/2017

CLINICAL DATA: Status post right hip arthroplasty

EXAM:
PORTABLE PELVIS 1-2 VIEWS

[pelvis ap]
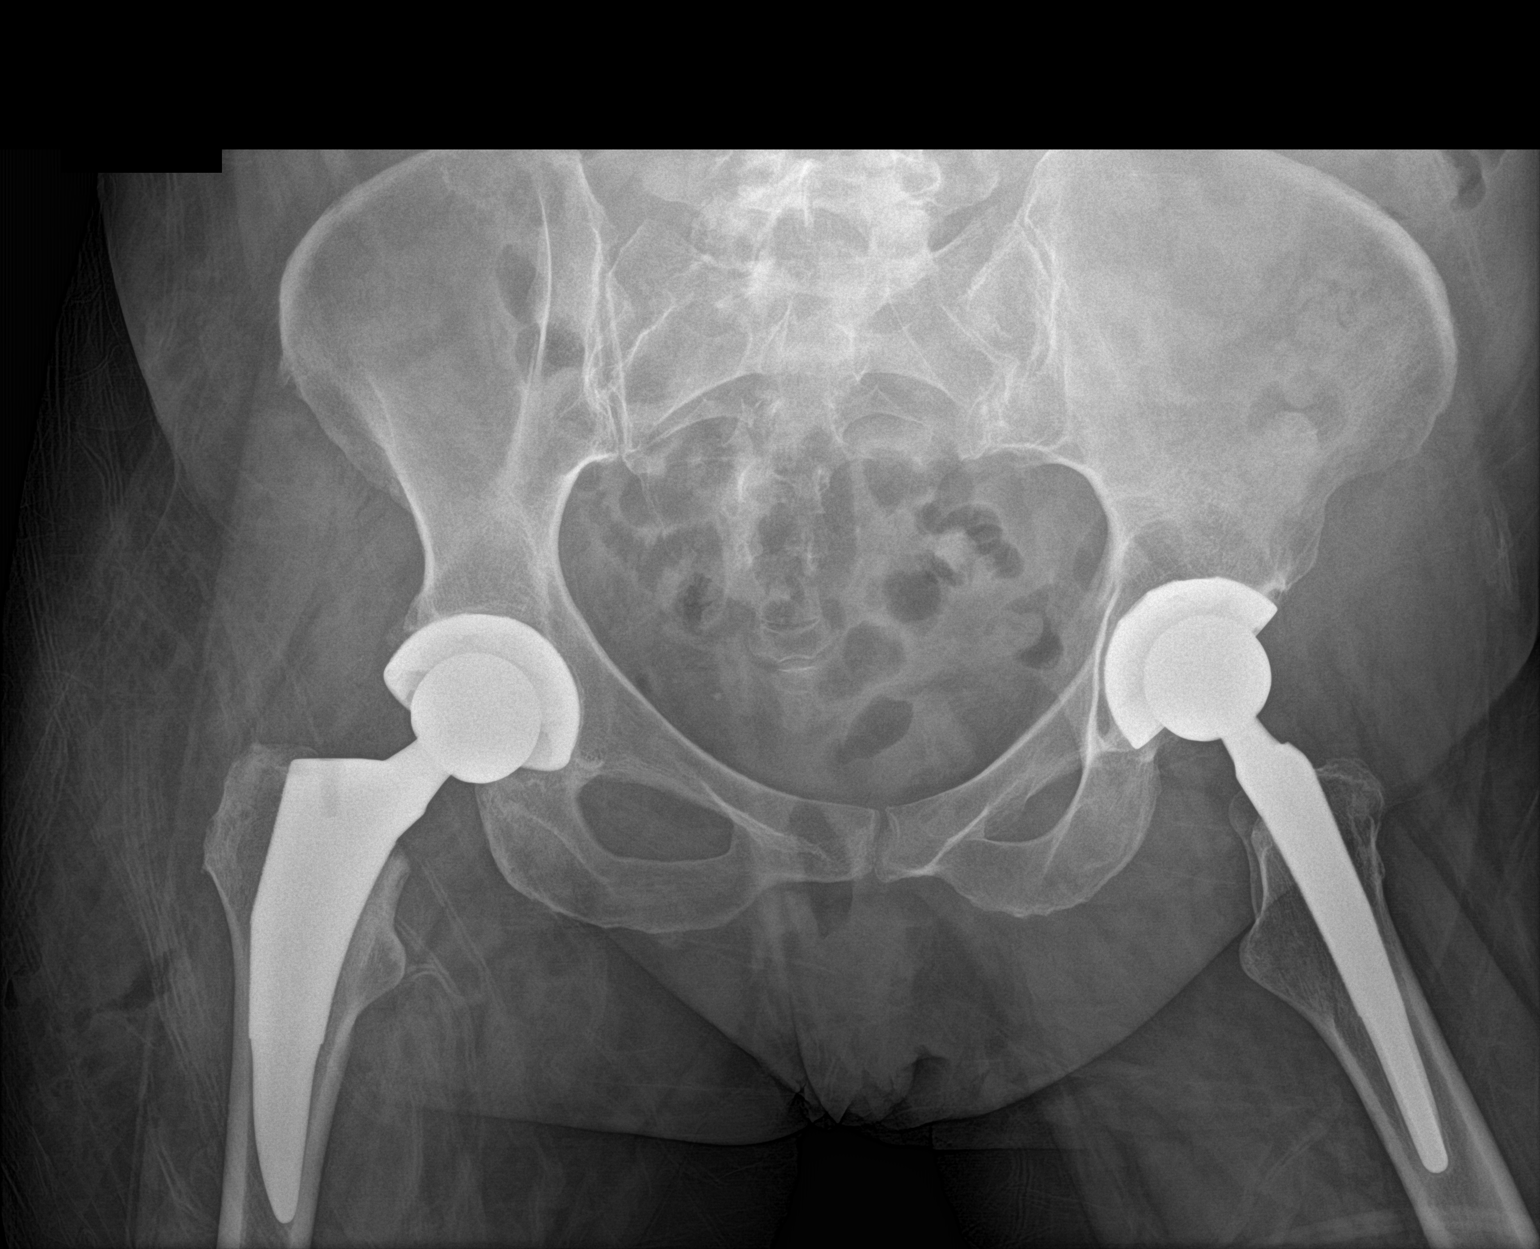

[1 of 1 positions shown; findings below may reference images not displayed]

FINDINGS: Status post right total hip arthroplasty. Arthroplasty components
are in their expected alignment without periprosthetic lucency or
fracture. Expected postoperative changes within the adjacent soft
tissues. Left total hip arthroplasty is also present, grossly
intact. Mild bilateral SI joint OA.
IMPRESSION: Interval postsurgical changes from total right hip arthroplasty
without evidence of immediate postoperative complication.

## 2020-05-23 ENCOUNTER — Other Ambulatory Visit: Payer: Self-pay

## 2020-05-23 ENCOUNTER — Ambulatory Visit
Admission: RE | Admit: 2020-05-23 | Discharge: 2020-05-23 | Disposition: A | Discharge status: Home / Self Care | Payer: Commercial Managed Care - PPO | Source: Ambulatory Visit | Attending: Emergency Medicine | Admitting: Emergency Medicine

## 2020-05-23 VITALS — BP 101/68 | HR 92 | Temp 99.0°F | Resp 19 | Ht 63.0 in | Wt 190.0 lb

## 2020-05-23 DIAGNOSIS — R21 Rash and other nonspecific skin eruption: Secondary | ICD-10-CM | POA: Diagnosis not present

## 2020-05-23 DIAGNOSIS — Z1152 Encounter for screening for COVID-19: Secondary | ICD-10-CM

## 2020-05-23 DIAGNOSIS — R519 Headache, unspecified: Secondary | ICD-10-CM

## 2020-05-23 DIAGNOSIS — R509 Fever, unspecified: Secondary | ICD-10-CM | POA: Diagnosis not present

## 2020-05-23 MED ORDER — FLUTICASONE PROPIONATE 50 MCG/ACT NA SUSP: 1.0000 | Freq: Every day | NASAL | 0 refills | Status: DC

## 2020-05-23 MED ORDER — HYDROCORTISONE 1 % EX CREA: TOPICAL_CREAM | CUTANEOUS | 0 refills | Status: AC

## 2020-05-23 NOTE — ED Provider Notes (Signed)
[facility]   [id number] 05/23/20 Arrival Time: 1451   Chief Complaint  Patient presents with  . Rash     SUBJECTIVE: History from: patient.  INFANT Julie Cameron is a 64 y.o. female who presented to the urgent care with a complaint of fever, headache, sinus congestion for the past 3 days.  Denies nasal discharge.  She reports red itchy rash on forehead last night.  Denies sick exposure to COVID, flu or strep.  Denies recent travel.  Has tried OTC Zyrtec with mild relief.  Denies aggravating factors.  Denies previous symptoms in the past.   Denies  fatigue, sinus pain, rhinorrhea, sore throat, SOB, wheezing, chest pain, nausea, changes in bowel or bladder habits.     ROS: As per HPI.  All other pertinent ROS negative.      Past Medical History:  Diagnosis Date  . Acid reflux   . Anxiety   . Arthritis   . Cellulitis   . Depression   . Hyperlipemia   . Hypertension   . Renal disorder    Dr. Deuturding  . Wears glasses    Past Surgical History:  Procedure Laterality Date  . APPENDECTOMY    . COLONOSCOPY    . JOINT REPLACEMENT     Lt hip  . LAPAROSCOPIC APPENDECTOMY N/A 09/18/2012   Procedure: APPENDECTOMY LAPAROSCOPIC;  Surgeon: Fabio Bering, MD;  Location: [facility];  Service: General;  Laterality: N/A;  . SIGMOIDOSCOPY    . TOTAL HIP ARTHROPLASTY Left 04/26/2017   Procedure: LEFT TOTAL HIP ARTHROPLASTY ANTERIOR APPROACH;  Surgeon: Samson Frederic, MD;  Location: [facility];  Service: Orthopedics;  Laterality: Left;  Needs RNFA  . TOTAL HIP ARTHROPLASTY Right 01/25/2019   Procedure: TOTAL HIP ARTHROPLASTY ANTERIOR APPROACH;  Surgeon: Samson Frederic, MD;  Location: [facility];  Service: Orthopedics;  Laterality: Right;  . WISDOM TOOTH EXTRACTION     No Known Allergies No current facility-administered medications on file prior to encounter.   Current Outpatient Medications on File Prior to Encounter  Medication Sig Dispense Refill  . aspirin 81 MG chewable tablet Chew 1  tablet (81 mg total) by mouth 2 (two) times daily. 60 tablet 1  . buPROPion (WELLBUTRIN XL) 300 MG 24 hr tablet Take 300 mg by mouth daily.     . cycloSPORINE (RESTASIS) 0.05 % ophthalmic emulsion Place 1 drop into both eyes 2 (two) times daily.    Marland Kitchen docusate sodium (COLACE) 100 MG capsule Take 1 capsule (100 mg total) by mouth 2 (two) times daily. (Patient not taking: Reported on 01/13/2019) 60 capsule 1  . escitalopram (LEXAPRO) 20 MG tablet Take 20 mg by mouth daily.     . Eszopiclone 3 MG TABS Take 3 mg by mouth at bedtime. Take immediately before bedtime    . hydrochlorothiazide (HYDRODIURIL) 25 MG tablet Take 25 mg by mouth every morning.    Marland Kitchen HYDROcodone-acetaminophen (NORCO/VICODIN) 5-325 MG tablet Take 1-2 tablets by mouth every 4 (four) hours as needed for moderate pain (pain score 4-6). 30 tablet 0  . ibuprofen (ADVIL,MOTRIN) 200 MG tablet Take 200-400 mg by mouth every 6 (six) hours as needed for headache or moderate pain.     Marland Kitchen lisinopril (PRINIVIL,ZESTRIL) 40 MG tablet Take 40 mg by mouth at bedtime.     . ondansetron (ZOFRAN) 4 MG tablet Take 1 tablet (4 mg total) by mouth every 6 (six) hours as needed for nausea. 20 tablet 0  . rosuvastatin (CRESTOR) 10 MG tablet Take 10  mg by mouth daily.    Marland Kitchen senna (SENOKOT) 8.6 MG TABS tablet Take 2 tablets (17.2 mg total) by mouth at bedtime. (Patient not taking: Reported on 01/13/2019) 120 each 0   Social History   Socioeconomic History  . Marital status: Married    Spouse name: Not on file  . Number of children: Not on file  . Years of education: Not on file  . Highest education level: Not on file  Occupational History  . Not on file  Tobacco Use  . Smoking status: Former Smoker    Packs/day: 0.50    Years: 15.00    Pack years: 7.50    Types: Cigars, Cigarettes    Quit date: 2000    Years since quitting: 21.8  . Smokeless tobacco: Never Used  Vaping Use  . Vaping Use: Never used  Substance and Sexual Activity  . Alcohol use: No    . Drug use: No  . Sexual activity: Yes    Birth control/protection: None  Other Topics Concern  . Not on file  Social History Narrative  . Not on file   Social Determinants of Health   Financial Resource Strain:   . Difficulty of Paying Living Expenses: Not on file  Food Insecurity:   . Worried About Programme researcher, broadcasting/film/video in the Last Year: Not on file  . Ran Out of Food in the Last Year: Not on file  Transportation Needs:   . Lack of Transportation (Medical): Not on file  . Lack of Transportation (Non-Medical): Not on file  Physical Activity:   . Days of Exercise per Week: Not on file  . Minutes of Exercise per Session: Not on file  Stress:   . Feeling of Stress : Not on file  Social Connections:   . Frequency of Communication with Friends and Family: Not on file  . Frequency of Social Gatherings with Friends and Family: Not on file  . Attends Religious Services: Not on file  . Active Member of Clubs or Organizations: Not on file  . Attends Banker Meetings: Not on file  . Marital Status: Not on file  Intimate Partner Violence:   . Fear of Current or Ex-Partner: Not on file  . Emotionally Abused: Not on file  . Physically Abused: Not on file  . Sexually Abused: Not on file   Family History  Problem Relation Age of Onset  . Emphysema Mother   . Lung cancer Mother   . Diabetes Mellitus II Father   . Heart attack Father     OBJECTIVE:  Vitals:   05/23/20 1458 05/23/20 1501  BP: 101/68   Pulse: 92   Resp: 19   Temp: 99 F (37.2 C)   TempSrc: Oral   SpO2: 94%   Weight:  190 lb (86.2 kg)  Height:  5\' 3"  (1.6 m)     General appearance: alert; appears fatigued, but nontoxic; speaking in full sentences and tolerating own secretions HEENT: NCAT; Ears: EACs clear, TMs pearly gray; Eyes: PERRL.  EOM grossly intact. Sinuses: nontender; Nose: nares patent without rhinorrhea, Throat: oropharynx clear, tonsils non erythematous or enlarged, uvula midline   Neck: supple without LAD Lungs: unlabored respirations, symmetrical air entry; cough: none; no respiratory distress; CTAB Heart: regular rate and rhythm.  Radial pulses 2+ symmetrical bilaterally Skin: Macular rash on forehead, warm and dry Psychological: alert and cooperative; normal mood and affect  LABS:  No results found for this or any previous visit (from the  past 24 hour(s)).   ASSESSMENT & PLAN:  1. Fever, unknown origin   2. Acute nonintractable headache, unspecified headache type   3. Encounter for screening for COVID-19   4. Rash     Meds ordered this encounter  Medications  . fluticasone (FLONASE) 50 MCG/ACT nasal spray    Sig: Place 1 spray into both nostrils daily for 14 days.    Dispense:  16 g    Refill:  0  . hydrocortisone cream 1 %    Sig: Apply to affected area 2 times daily    Dispense:  15 g    Refill:  0    Discharge instructions  COVID testing ordered.  It will take between 2-7 days for test results.  Someone will contact you regarding abnormal results.    In the meantime: You should remain isolated in your home for 10 days from symptom onset AND greater than 24 hours after symptoms resolution (absence of fever without the use of fever-reducing medication and improvement in respiratory symptoms), whichever is longer Get plenty of rest and push fluids Continue to use Zyrtec for nasal congestion, runny nose, and/or sore throat Flonase was prescribed for nasal congestion and runny nose Hydrocortisone was prescribed for rash  Use medications daily for symptom relief Use OTC medications like ibuprofen or tylenol as needed fever or pain Call or go to the ED if you have any new or worsening symptoms such as fever, worsening cough, shortness of breath, chest tightness, chest pain, turning blue, changes in mental status, etc...   Reviewed expectations re: course of current medical issues. Questions answered. Outlined signs and symptoms indicating need for  more acute intervention. Patient verbalized understanding. After Visit Summary given.         Durward Parcel, FNP 05/23/20 [phone]

## 2020-05-23 NOTE — Discharge Instructions (Signed)
COVID testing ordered.  It will take between 2-7 days for test results.  Someone will contact you regarding abnormal results.    In the meantime: You should remain isolated in your home for 10 days from symptom onset AND greater than 24 hours after symptoms resolution (absence of fever without the use of fever-reducing medication and improvement in respiratory symptoms), whichever is longer Get plenty of rest and push fluids Continue to use Zyrtec for nasal congestion, runny nose, and/or sore throat Flonase was prescribed for nasal congestion and runny nose Hydrocortisone was prescribed for rash  Use medications daily for symptom relief Use OTC medications like ibuprofen or tylenol as needed fever or pain Call or go to the ED if you have any new or worsening symptoms such as fever, worsening cough, shortness of breath, chest tightness, chest pain, turning blue, changes in mental status, etc..Marland Kitchen

## 2020-05-23 NOTE — ED Triage Notes (Signed)
Pt states she started with a headache x 3days ago, poss fever last night. Today pt has red itchy rash on forehead.

## 2020-05-24 LAB — COVID-19, FLU A+B AND RSV
Influenza A, NAA: NOT DETECTED
Influenza B, NAA: NOT DETECTED
RSV, NAA: NOT DETECTED
SARS-CoV-2, NAA: NOT DETECTED

## 2020-07-21 ENCOUNTER — Encounter: Payer: Self-pay | Admitting: Emergency Medicine

## 2020-07-21 ENCOUNTER — Ambulatory Visit
Admission: EM | Admit: 2020-07-21 | Discharge: 2020-07-21 | Disposition: A | Discharge status: Home / Self Care | Payer: Commercial Managed Care - PPO | Attending: Family Medicine | Admitting: Family Medicine

## 2020-07-21 ENCOUNTER — Other Ambulatory Visit: Payer: Self-pay

## 2020-07-21 DIAGNOSIS — N3001 Acute cystitis with hematuria: Secondary | ICD-10-CM | POA: Insufficient documentation

## 2020-07-21 DIAGNOSIS — R3915 Urgency of urination: Secondary | ICD-10-CM | POA: Insufficient documentation

## 2020-07-21 DIAGNOSIS — R350 Frequency of micturition: Secondary | ICD-10-CM | POA: Insufficient documentation

## 2020-07-21 DIAGNOSIS — R300 Dysuria: Secondary | ICD-10-CM | POA: Insufficient documentation

## 2020-07-21 DIAGNOSIS — M545 Low back pain, unspecified: Secondary | ICD-10-CM

## 2020-07-21 DIAGNOSIS — R1030 Lower abdominal pain, unspecified: Secondary | ICD-10-CM

## 2020-07-21 LAB — POCT URINALYSIS DIP (MANUAL ENTRY)
Bilirubin, UA: NEGATIVE
Glucose, UA: NEGATIVE mg/dL
Nitrite, UA: POSITIVE — AB
Protein Ur, POC: 30 mg/dL — AB
Spec Grav, UA: 1.025 (ref 1.010–1.025)
Urobilinogen, UA: 1 E.U./dL
pH, UA: 6.5 (ref 5.0–8.0)

## 2020-07-21 MED ORDER — CEPHALEXIN 500 MG PO CAPS: 500.0000 mg | ORAL_CAPSULE | Freq: Two times a day (BID) | ORAL | 0 refills | Status: AC

## 2020-07-21 NOTE — ED Triage Notes (Signed)
Burning on urination and urinary urgency with lower back pain and lower abd pain

## 2020-07-21 NOTE — Discharge Instructions (Signed)
I have sent in Keflex for you to take twice a day for 7 days  May take Azo maximum relief over-the-counter 3 times a day for up to 3 days  We will culture your urine and if a change in antibiotics is needed, we will call and let you know  Follow up with this office or with primary care if symptoms are persisting.  Follow up in the ER for high fever, trouble swallowing, trouble breathing, other concerning symptoms.

## 2020-07-24 LAB — URINE CULTURE

## 2020-07-26 NOTE — ED Provider Notes (Signed)
[facility]   CC: UTI  SUBJECTIVE:  Julie Cameron is a 65 y.o. female who complains of urinary frequency, low abdominal pain, low back pain, urgency and dysuria for the past 3 days. Patient denies a precipitating event, recent sexual encounter, excessive caffeine intake. Localizes the pain to the lower abdomen. Pain is intermittent and describes it as burning.  Has tried OTC medications without relief. Symptoms are made worse with urination. Admits to similar symptoms in the past.  Denies fever, chills, nausea, vomiting, abnormal vaginal discharge or bleeding, hematuria.    LMP: No LMP recorded. Patient is postmenopausal.  ROS: As in HPI.  All other pertinent ROS negative.     Past Medical History:  Diagnosis Date  . Acid reflux   . Anxiety   . Arthritis   . Cellulitis   . Depression   . Hyperlipemia   . Hypertension   . Renal disorder    Dr. Deuturding  . Wears glasses    Past Surgical History:  Procedure Laterality Date  . APPENDECTOMY    . COLONOSCOPY    . JOINT REPLACEMENT     Lt hip  . LAPAROSCOPIC APPENDECTOMY N/A 09/18/2012   Procedure: APPENDECTOMY LAPAROSCOPIC;  Surgeon: Fabio Bering, MD;  Location: [facility];  Service: General;  Laterality: N/A;  . SIGMOIDOSCOPY    . TOTAL HIP ARTHROPLASTY Left 04/26/2017   Procedure: LEFT TOTAL HIP ARTHROPLASTY ANTERIOR APPROACH;  Surgeon: Samson Frederic, MD;  Location: [facility];  Service: Orthopedics;  Laterality: Left;  Needs RNFA  . TOTAL HIP ARTHROPLASTY Right 01/25/2019   Procedure: TOTAL HIP ARTHROPLASTY ANTERIOR APPROACH;  Surgeon: Samson Frederic, MD;  Location: [facility];  Service: Orthopedics;  Laterality: Right;  . WISDOM TOOTH EXTRACTION     No Known Allergies No current facility-administered medications on file prior to encounter.   Current Outpatient Medications on File Prior to Encounter  Medication Sig Dispense Refill  . aspirin 81 MG chewable tablet Chew 1 tablet (81 mg total) by mouth 2 (two) times daily.  60 tablet 1  . buPROPion (WELLBUTRIN XL) 300 MG 24 hr tablet Take 300 mg by mouth daily.     . cycloSPORINE (RESTASIS) 0.05 % ophthalmic emulsion Place 1 drop into both eyes 2 (two) times daily.    Marland Kitchen docusate sodium (COLACE) 100 MG capsule Take 1 capsule (100 mg total) by mouth 2 (two) times daily. (Patient not taking: Reported on 01/13/2019) 60 capsule 1  . escitalopram (LEXAPRO) 20 MG tablet Take 20 mg by mouth daily.     . Eszopiclone 3 MG TABS Take 3 mg by mouth at bedtime. Take immediately before bedtime    . fluticasone (FLONASE) 50 MCG/ACT nasal spray Place 1 spray into both nostrils daily for 14 days. 16 g 0  . hydrochlorothiazide (HYDRODIURIL) 25 MG tablet Take 25 mg by mouth every morning.    Marland Kitchen HYDROcodone-acetaminophen (NORCO/VICODIN) 5-325 MG tablet Take 1-2 tablets by mouth every 4 (four) hours as needed for moderate pain (pain score 4-6). 30 tablet 0  . hydrocortisone cream 1 % Apply to affected area 2 times daily 15 g 0  . ibuprofen (ADVIL,MOTRIN) 200 MG tablet Take 200-400 mg by mouth every 6 (six) hours as needed for headache or moderate pain.     Marland Kitchen lisinopril (PRINIVIL,ZESTRIL) 40 MG tablet Take 40 mg by mouth at bedtime.     . ondansetron (ZOFRAN) 4 MG tablet Take 1 tablet (4 mg total) by mouth every 6 (six) hours as needed  for nausea. 20 tablet 0  . rosuvastatin (CRESTOR) 10 MG tablet Take 10 mg by mouth daily.    Marland Kitchen senna (SENOKOT) 8.6 MG TABS tablet Take 2 tablets (17.2 mg total) by mouth at bedtime. (Patient not taking: Reported on 01/13/2019) 120 each 0   Social History   Socioeconomic History  . Marital status: Married    Spouse name: Not on file  . Number of children: Not on file  . Years of education: Not on file  . Highest education level: Not on file  Occupational History  . Not on file  Tobacco Use  . Smoking status: Former Smoker    Packs/day: 0.50    Years: 15.00    Pack years: 7.50    Types: Cigars, Cigarettes    Quit date: 2000    Years since quitting:  22.0  . Smokeless tobacco: Never Used  Vaping Use  . Vaping Use: Never used  Substance and Sexual Activity  . Alcohol use: No  . Drug use: No  . Sexual activity: Yes    Birth control/protection: None  Other Topics Concern  . Not on file  Social History Narrative  . Not on file   Social Determinants of Health   Financial Resource Strain: Not on file  Food Insecurity: Not on file  Transportation Needs: Not on file  Physical Activity: Not on file  Stress: Not on file  Social Connections: Not on file  Intimate Partner Violence: Not on file   Family History  Problem Relation Age of Onset  . Emphysema Mother   . Lung cancer Mother   . Diabetes Mellitus II Father   . Heart attack Father     OBJECTIVE:  Vitals:   07/21/20 1535 07/21/20 1541  BP: 128/77   Pulse: 100   Resp: 16   Temp: 98.7 F (37.1 C)   TempSrc: Oral   SpO2: 94%   Weight:  190 lb (86.2 kg)  Height:  5\' 3"  (1.6 m)   General appearance: AOx3 in no acute distress HEENT: NCAT. Oropharynx clear.  Lungs: clear to auscultation bilaterally without adventitious breath sounds Heart: regular rate and rhythm. Radial pulses 2+ symmetrical bilaterally Abdomen: soft; non-distended; suprapubic tenderness; bowel sounds present; no guarding or rebound tenderness Back:  CVA tenderness Extremities: no edema; symmetrical with no gross deformities Skin: warm and dry Neurologic: Ambulates from chair to exam table without difficulty Psychological: alert and cooperative; normal mood and affect  Labs Reviewed  URINE CULTURE - Abnormal; Notable for the following components:      Result Value   Culture MULTIPLE SPECIES PRESENT, SUGGEST RECOLLECTION (*)    All other components within normal limits  POCT URINALYSIS DIP (MANUAL ENTRY) - Abnormal; Notable for the following components:   Color, UA straw (*)    Clarity, UA cloudy (*)    Ketones, POC UA trace (5) (*)    Blood, UA trace-intact (*)    Protein Ur, POC =30 (*)     Nitrite, UA Positive (*)    Leukocytes, UA Large (3+) (*)    All other components within normal limits    ASSESSMENT & PLAN:  1. Acute cystitis with hematuria   2. Dysuria   3. Urinary frequency   4. Urinary urgency   5. Lower abdominal pain   6. Acute bilateral low back pain without sciatica     Meds ordered this encounter  Medications  . cephALEXin (KEFLEX) 500 MG capsule    Sig: Take 1 capsule (500  mg total) by mouth 2 (two) times daily for 7 days.    Dispense:  14 capsule    Refill:  0    Order Specific Question:   Supervising Provider    Answer:   Merrilee Jansky [[id number]]   UA is concerning for infection today Urine culture sent  Prescribed Keflex BID x 7 days We will call you with abnormal results that need further treatment Push fluids and get plenty of rest Take antibiotic as directed and to completion Take pyridium as prescribed and as needed for symptomatic relief Follow up with PCP if symptoms persists Return here or go to ER if you have any new or worsening symptoms such as fever, worsening abdominal pain, nausea/vomiting, flank pain  Outlined signs and symptoms indicating need for more acute intervention Patient verbalized understanding After Visit Summary given     Moshe Cipro, NP 07/26/20 1034

## 2021-05-15 ENCOUNTER — Other Ambulatory Visit: Payer: Self-pay

## 2021-10-24 ENCOUNTER — Other Ambulatory Visit: Payer: Self-pay | Admitting: Radiology

## 2021-12-04 ENCOUNTER — Other Ambulatory Visit (HOSPITAL_COMMUNITY): Payer: Self-pay | Admitting: Orthopedic Surgery

## 2021-12-04 ENCOUNTER — Ambulatory Visit (HOSPITAL_COMMUNITY)
Admission: RE | Admit: 2021-12-04 | Discharge: 2021-12-04 | Disposition: A | Discharge status: Home / Self Care | Payer: Commercial Managed Care - PPO | Source: Ambulatory Visit | Attending: Cardiology | Admitting: Cardiology

## 2021-12-04 DIAGNOSIS — M79662 Pain in left lower leg: Secondary | ICD-10-CM | POA: Insufficient documentation

## 2023-06-11 ENCOUNTER — Other Ambulatory Visit: Payer: Self-pay | Admitting: Family Medicine

## 2023-06-11 ENCOUNTER — Ambulatory Visit
Admission: EM | Admit: 2023-06-11 | Discharge: 2023-06-11 | Disposition: A | Discharge status: Home / Self Care | Payer: Medicare HMO | Attending: Family Medicine | Admitting: Family Medicine

## 2023-06-11 ENCOUNTER — Telehealth: Payer: Medicare HMO | Admitting: Family Medicine

## 2023-06-11 ENCOUNTER — Telehealth: Payer: Self-pay | Admitting: Emergency Medicine

## 2023-06-11 DIAGNOSIS — U071 COVID-19: Secondary | ICD-10-CM

## 2023-06-11 MED ORDER — MOLNUPIRAVIR EUA 200MG CAPSULE: 4.0000 | ORAL_CAPSULE | Freq: Two times a day (BID) | ORAL | 0 refills | Status: DC

## 2023-06-11 MED ORDER — PAXLOVID (150/100) 10 X 150 MG & 10 X 100MG PO TBPK: 2.0000 | ORAL_TABLET | Freq: Two times a day (BID) | ORAL | 0 refills | Status: DC

## 2023-06-11 MED ORDER — PAXLOVID (150/100) 10 X 150 MG & 10 X 100MG PO TBPK: 2.0000 | ORAL_TABLET | Freq: Two times a day (BID) | ORAL | 0 refills | Status: AC

## 2023-06-11 MED ORDER — FLUTICASONE PROPIONATE 50 MCG/ACT NA SUSP: 1.0000 | Freq: Every day | NASAL | 0 refills | Status: AC

## 2023-06-11 MED ORDER — BENZONATATE 100 MG PO CAPS: 100.0000 mg | ORAL_CAPSULE | Freq: Three times a day (TID) | ORAL | 0 refills | Status: AC | PRN

## 2023-06-11 MED ORDER — PROMETHAZINE-DM 6.25-15 MG/5ML PO SYRP: 5.0000 mL | ORAL_SOLUTION | Freq: Four times a day (QID) | ORAL | 0 refills | Status: AC | PRN

## 2023-06-11 NOTE — Telephone Encounter (Signed)
Patient called and states her insurance will not cover molnupiravir.  The renal dose for paxlovid sent into [facility] for patient.  Patient instructed to hold her crestor while taking the paxlovid.

## 2023-06-11 NOTE — Discharge Instructions (Signed)
In addition to the prescribed medications, you may take Flonase nasal spray twice a day, Coricidin HBP, plain Mucinex, use sinus rinses and humidifiers

## 2023-06-11 NOTE — ED Provider Notes (Signed)
[facility]    CSN: [id number] Arrival date & time: 06/11/23  1031      History   Chief Complaint No chief complaint on file.   HPI Julie Cameron is a 67 y.o. female.   Patient presenting today with 1 day history of headache, fever, congestion, sore throat, body aches, fatigue.  Denies chest pain, shortness of breath, abdominal pain, nausea vomiting or diarrhea.  So far trying Tylenol and Zyrtec with minimal relief.  Tested positive for COVID yesterday.    Past Medical History:  Diagnosis Date   Acid reflux    Anxiety    Arthritis    Cellulitis    Depression    Hyperlipemia    Hypertension    Renal disorder    Dr. Jaynie Crumble   Wears glasses     Patient Active Problem List   Diagnosis Date Noted   Avascular necrosis of hip, right (HCC) 01/25/2019   Avascular necrosis of hip, left (HCC) 04/26/2017    Past Surgical History:  Procedure Laterality Date   APPENDECTOMY     COLONOSCOPY     JOINT REPLACEMENT     Lt hip   LAPAROSCOPIC APPENDECTOMY N/A 09/18/2012   Procedure: APPENDECTOMY LAPAROSCOPIC;  Surgeon: Fabio Bering, MD;  Location: [facility];  Service: General;  Laterality: N/A;   SIGMOIDOSCOPY     TOTAL HIP ARTHROPLASTY Left 04/26/2017   Procedure: LEFT TOTAL HIP ARTHROPLASTY ANTERIOR APPROACH;  Surgeon: Samson Frederic, MD;  Location: [facility];  Service: Orthopedics;  Laterality: Left;  Needs RNFA   TOTAL HIP ARTHROPLASTY Right 01/25/2019   Procedure: TOTAL HIP ARTHROPLASTY ANTERIOR APPROACH;  Surgeon: Samson Frederic, MD;  Location: [facility];  Service: Orthopedics;  Laterality: Right;   WISDOM TOOTH EXTRACTION      OB History   No obstetric history on file.      Home Medications    Prior to Admission medications   Medication Sig Start Date End Date Taking? Authorizing Provider  molnupiravir EUA (LAGEVRIO) 200 mg CAPS capsule Take 4 capsules (800 mg total) by mouth 2 (two) times daily for 5 days. 06/11/23 06/16/23 Yes Particia Nearing, PA-C   promethazine-dextromethorphan (PROMETHAZINE-DM) 6.25-15 MG/5ML syrup Take 5 mLs by mouth 4 (four) times daily as needed. 06/11/23  Yes Particia Nearing, PA-C  aspirin 81 MG chewable tablet Chew 1 tablet (81 mg total) by mouth 2 (two) times daily. 01/26/19   Swinteck, Arlys John, MD  benzonatate (TESSALON) 100 MG capsule Take 1 capsule (100 mg total) by mouth 3 (three) times daily as needed for up to 7 days for cough. 06/11/23 06/18/23  Reed Pandy, PA-C  buPROPion (WELLBUTRIN XL) 300 MG 24 hr tablet Take 300 mg by mouth daily.     [provider]  cycloSPORINE (RESTASIS) 0.05 % ophthalmic emulsion Place 1 drop into both eyes 2 (two) times daily.    [provider]  docusate sodium (COLACE) 100 MG capsule Take 1 capsule (100 mg total) by mouth 2 (two) times daily. Patient not taking: Reported on 01/13/2019 04/27/17   Samson Frederic, MD  escitalopram (LEXAPRO) 20 MG tablet Take 20 mg by mouth daily.     [provider]  Eszopiclone 3 MG TABS Take 3 mg by mouth at bedtime. Take immediately before bedtime    [provider]  fluticasone (FLONASE) 50 MCG/ACT nasal spray Place 1 spray into both nostrils daily for 14 days. 06/11/23 06/25/23  Reed Pandy, PA-C  hydrochlorothiazide (HYDRODIURIL) 25 MG tablet Take 25  mg by mouth every morning.    [provider]  HYDROcodone-acetaminophen (NORCO/VICODIN) 5-325 MG tablet Take 1-2 tablets by mouth every 4 (four) hours as needed for moderate pain (pain score 4-6). 01/26/19   Swinteck, Arlys John, MD  hydrocortisone cream 1 % Apply to affected area 2 times daily 05/23/20   Avegno, Zachery Dakins, FNP  ibuprofen (ADVIL,MOTRIN) 200 MG tablet Take 200-400 mg by mouth every 6 (six) hours as needed for headache or moderate pain.     [provider]  lisinopril (PRINIVIL,ZESTRIL) 40 MG tablet Take 40 mg by mouth at bedtime.     [provider]  ondansetron (ZOFRAN) 4 MG tablet Take 1 tablet (4 mg total) by mouth  every 6 (six) hours as needed for nausea. 01/26/19   Swinteck, Arlys John, MD  rosuvastatin (CRESTOR) 10 MG tablet Take 10 mg by mouth daily.    [provider]  senna (SENOKOT) 8.6 MG TABS tablet Take 2 tablets (17.2 mg total) by mouth at bedtime. Patient not taking: Reported on 01/13/2019 04/27/17   Samson Frederic, MD    Family History Family History  Problem Relation Age of Onset   Emphysema Mother    Lung cancer Mother    Diabetes Mellitus II Father    Heart attack Father     Social History Social History   Tobacco Use   Smoking status: Former    Current packs/day: 0.00    Average packs/day: 0.5 packs/day for 15.0 years (7.5 ttl pk-yrs)    Types: Cigars, Cigarettes    Start date: 72    Quit date: 2000    Years since quitting: 24.9   Smokeless tobacco: Never  Vaping Use   Vaping status: Never Used  Substance Use Topics   Alcohol use: No   Drug use: No     Allergies   Patient has no known allergies.   Review of Systems Review of Systems Per HPI  Physical Exam Triage Vital Signs ED Triage Vitals  Encounter Vitals Group     BP 06/11/23 1144 110/67     Systolic BP Percentile --      Diastolic BP Percentile --      Pulse Rate 06/11/23 1144 73     Resp 06/11/23 1144 18     Temp 06/11/23 1144 97.8 F (36.6 C)     Temp Source 06/11/23 1144 Oral     SpO2 06/11/23 1144 93 %     Weight --      Height --      Head Circumference --      Peak Flow --      Pain Score 06/11/23 1058 5     Pain Loc --      Pain Education --      Exclude from Growth Chart --    No data found.  Updated Vital Signs BP 110/67 (BP Location: Right Arm)   Pulse 73   Temp 97.8 F (36.6 C) (Oral)   Resp 18   SpO2 93%   Visual Acuity Right Eye Distance:   Left Eye Distance:   Bilateral Distance:    Right Eye Near:   Left Eye Near:    Bilateral Near:     Physical Exam Vitals and nursing note reviewed.  Constitutional:      Appearance: Normal appearance.  HENT:      Head: Atraumatic.     Right Ear: Tympanic membrane and external ear normal.     Left Ear: Tympanic membrane and external  ear normal.     Nose: Rhinorrhea present.     Mouth/Throat:     Mouth: Mucous membranes are moist.     Pharynx: Posterior oropharyngeal erythema present.  Eyes:     Extraocular Movements: Extraocular movements intact.     Conjunctiva/sclera: Conjunctivae normal.  Cardiovascular:     Rate and Rhythm: Normal rate and regular rhythm.     Heart sounds: Normal heart sounds.  Pulmonary:     Effort: Pulmonary effort is normal.     Breath sounds: Normal breath sounds. No wheezing or rales.  Musculoskeletal:        General: Normal range of motion.     Cervical back: Normal range of motion and neck supple.  Skin:    General: Skin is warm and dry.  Neurological:     Mental Status: She is alert and oriented to person, place, and time.  Psychiatric:        Mood and Affect: Mood normal.        Thought Content: Thought content normal.      UC Treatments / Results  Labs (all labs ordered are listed, but only abnormal results are displayed) Labs Reviewed - No data to display  EKG   Radiology No results found.  Procedures Procedures (including critical care time)  Medications Ordered in UC Medications - No data to display  Initial Impression / Assessment and Plan / UC Course  I have reviewed the triage vital signs and the nursing notes.  Pertinent labs & imaging results that were available during my care of the patient were reviewed by me and considered in my medical decision making (see chart for details).     Treat with molnupiravir, Phenergan DM, supportive over-the-counter medications and home care.  Return for worsening symptoms.  Final Clinical Impressions(s) / UC Diagnoses   Final diagnoses:  COVID-19     Discharge Instructions      In addition to the prescribed medications, you may take Flonase nasal spray twice a day, Coricidin HBP, plain  Mucinex, use sinus rinses and humidifiers    ED Prescriptions     Medication Sig Dispense Auth. Provider   molnupiravir EUA (LAGEVRIO) 200 mg CAPS capsule Take 4 capsules (800 mg total) by mouth 2 (two) times daily for 5 days. 40 capsule Particia Nearing, [place]   promethazine-dextromethorphan (PROMETHAZINE-DM) 6.25-15 MG/5ML syrup Take 5 mLs by mouth 4 (four) times daily as needed. 100 mL Particia Nearing, [place]      PDMP not reviewed this encounter.   Particia Nearing, [place] 06/11/23 1249

## 2023-06-11 NOTE — ED Triage Notes (Signed)
Pt reports she tested positive for Covid x 1 day   SXS included headache, fever, chesy congestion, sore throat, and body aches x 2 days    Pt took tylenol and zyrtec which gave some relief

## 2023-06-11 NOTE — Telephone Encounter (Signed)
Paxlovid sent.

## 2023-06-11 NOTE — Progress Notes (Signed)
E-Visit  for Positive Covid Test Result   We are sorry you are not feeling well. We are here to help!  You have tested positive for COVID-19, meaning that you were infected with the novel coronavirus and could give the virus to others.  Most people with COVID-19 have mild illness and can recover at home without medical care. Do not leave your home, except to get medical care. Do not visit public areas and do not go to places where you are unable to wear a mask. It is important that you stay home  to take care for yourself and to help protect other people in your home and community.      Isolation Instructions:   You are to isolate at home until you have been fever free for at least 24 hours without a fever-reducing medication, and symptoms have been steadily improving for 24 hours. At that time,  you can end isolation but need to mask for an additional 5 days.  If you must be around other household members who do not have symptoms, you need to make sure that both you and the family members are masking consistently with a high-quality mask.  If you note any worsening of symptoms despite treatment, please seek an in-person evaluation ASAP. If you note any significant shortness of breath or any chest pain, please seek ER evaluation. Please do not delay care!   Go to the nearest hospital ED for assessment if fever/cough/breathlessness are severe or illness seems like a threat to life.    The following symptoms may appear 2-14 days after exposure: Fever Cough Shortness of breath or difficulty breathing Chills Repeated shaking with chills Muscle pain Headache Sore throat New loss of taste or smell Fatigue Congestion or runny nose Nausea or vomiting Diarrhea  You can use medication such as  prescription cough medication called Tessalon Perles 100 mg. You may take 1-2 capsules every 8 hours as needed for cough, and prescription for Fluticasone nasal spray 2 sprays in each nostril one time  per day  You may also take acetaminophen (Tylenol) as needed for fever.  HOME CARE: Only take medications as instructed by your medical team. Drink plenty of fluids and get plenty of rest. A steam or ultrasonic humidifier can help if you have congestion.   GET HELP RIGHT AWAY IF YOU HAVE EMERGENCY WARNING SIGNS.  Call 911 or proceed to your closest emergency facility if: You develop worsening high fever. Trouble breathing Bluish lips or face Persistent pain or pressure in the chest New confusion Inability to wake or stay awake You cough up blood. Your symptoms become more severe Inability to hold down food or fluids  This list is not all possible symptoms. Contact your medical provider for any symptoms that are severe or concerning to you.   Your e-visit answers were reviewed by a board certified advanced clinical practitioner to complete your personal care plan.  Depending on the condition, your plan could have included both over the counter or prescription medications.  If there is a problem please reply once you have received a response from your provider.  Your safety is important to [place].  If you have drug allergies check your prescription carefully.    You can use MyChart to ask questions about today's visit, request a non-urgent call back, or ask for a work or school excuse for 24 hours related to this e-Visit. If it has been greater than 24 hours you will need to follow up with  your provider, or enter a new e-Visit to address those concerns. You will get an e-mail in the next two days asking about your experience.  I hope that your e-visit has been valuable and will speed your recovery. Thank you for using e-visits.  I have spent 5 minutes in review of e-visit questionnaire, review and updating patient chart, medical decision making and response to patient.   Reed Pandy, PA-C

## 2024-01-05 ENCOUNTER — Ambulatory Visit
Admission: RE | Admit: 2024-01-05 | Discharge: 2024-01-05 | Disposition: A | Discharge status: Home / Self Care | Source: Ambulatory Visit

## 2024-01-05 ENCOUNTER — Other Ambulatory Visit: Payer: Self-pay

## 2024-01-05 VITALS — BP 114/76 | HR 58 | Temp 98.0°F | Resp 20

## 2024-01-05 DIAGNOSIS — H6123 Impacted cerumen, bilateral: Secondary | ICD-10-CM | POA: Diagnosis not present

## 2024-01-05 NOTE — ED Provider Notes (Signed)
 [facility]    CSN: [id number] Arrival date & time: 01/05/24  1752      History   Chief Complaint Chief Complaint  Patient presents with   Ear Fullness    Entered by patient    HPI Julie Cameron is a 68 y.o. female.   The history is provided by the patient.   Patient presents with a 1 week history of bilateral ear fullness.  Patient states that she went to a concert and used earplugs.  She states she was aware that she did have some wax buildup, but symptoms worsened after the earplug use.  Patient states that her hearing is decreased, states that she did have some soreness after the concert, but that has since improved.  She denies fever, chills, upper respiratory symptoms, dizziness, or lightheadedness.  States she has tried flushing the ears with over-the-counter drops at home with minimal relief of symptoms.  Past Medical History:  Diagnosis Date   Acid reflux    Anxiety    Arthritis    Cellulitis    Depression    Hyperlipemia    Hypertension    Renal disorder    Dr. Rebbecca   Wears glasses     Patient Active Problem List   Diagnosis Date Noted   Avascular necrosis of hip, right (HCC) 01/25/2019   Avascular necrosis of hip, left (HCC) 04/26/2017    Past Surgical History:  Procedure Laterality Date   APPENDECTOMY     COLONOSCOPY     JOINT REPLACEMENT     Lt hip   LAPAROSCOPIC APPENDECTOMY N/A 09/18/2012   Procedure: APPENDECTOMY LAPAROSCOPIC;  Surgeon: Thresa JAYSON Pulling, MD;  Location: [facility];  Service: General;  Laterality: N/A;   SIGMOIDOSCOPY     TOTAL HIP ARTHROPLASTY Left 04/26/2017   Procedure: LEFT TOTAL HIP ARTHROPLASTY ANTERIOR APPROACH;  Surgeon: Fidel Rogue, MD;  Location: [facility];  Service: Orthopedics;  Laterality: Left;  Needs RNFA   TOTAL HIP ARTHROPLASTY Right 01/25/2019   Procedure: TOTAL HIP ARTHROPLASTY ANTERIOR APPROACH;  Surgeon: Fidel Rogue, MD;  Location: [facility];  Service: Orthopedics;  Laterality: Right;   WISDOM TOOTH  EXTRACTION      OB History   No obstetric history on file.      Home Medications    Prior to Admission medications   Medication Sig Start Date End Date Taking? Authorizing Provider  atomoxetine (STRATTERA) 18 MG capsule Take by mouth. 08/02/23  Yes [provider]  MIEBO 1.338 GM/ML SOLN  12/24/23  Yes [provider]  MOUNJARO 12.5 MG/0.5ML Pen INJECT 12.5MG  ONCE WEEKLY FOR 4 WEEKS THEN CONTACT MD FOR NEXT DOSE 12/28/23  Yes [provider]  albuterol  (VENTOLIN  HFA) 108 (90 Base) MCG/ACT inhaler Inhale 2 puffs into the lungs every 4 (four) hours as needed.    [provider]  aspirin  81 MG chewable tablet Chew 1 tablet (81 mg total) by mouth 2 (two) times daily. 01/26/19   Swinteck, Rogue, MD  brexpiprazole (REXULTI) 2 MG TABS tablet 2 mg.    [provider]  buPROPion  (WELLBUTRIN  XL) 300 MG 24 hr tablet Take 300 mg by mouth daily.     [provider]  cycloSPORINE  (RESTASIS ) 0.05 % ophthalmic emulsion Place 1 drop into both eyes 2 (two) times daily.    [provider]  docusate sodium  (COLACE) 100 MG capsule Take 1 capsule (100 mg total) by mouth 2 (two) times daily. Patient not taking: Reported on 01/13/2019 04/27/17  Swinteck, Redell, MD  escitalopram  (LEXAPRO ) 20 MG tablet Take 20 mg by mouth daily.     [provider]  estradiol (ESTRACE) 0.1 MG/GM vaginal cream INSERT 1 GRAM VAGINALLY TWICE A WEEK EVERY DAY AT BEDTIME    [provider]  Eszopiclone 3 MG TABS Take 3 mg by mouth at bedtime. Take immediately before bedtime    [provider]  famotidine (PEPCID) 20 MG tablet Take 1 tablet twice a day by oral route as needed.    [provider]  fluticasone  (FLONASE ) 50 MCG/ACT nasal spray Place 1 spray into both nostrils daily for 14 days. 06/11/23 06/25/23  Kingston Robes, PA-C  hydrochlorothiazide  (HYDRODIURIL ) 25 MG tablet Take 25 mg by mouth every morning.    [provider]   HYDROcodone -acetaminophen  (NORCO/VICODIN) 5-325 MG tablet Take 1-2 tablets by mouth every 4 (four) hours as needed for moderate pain (pain score 4-6). 01/26/19   Swinteck, Redell, MD  hydrocortisone  cream 1 % Apply to affected area 2 times daily 05/23/20   Avegno, Komlanvi S, FNP  ibuprofen (ADVIL,MOTRIN) 200 MG tablet Take 200-400 mg by mouth every 6 (six) hours as needed for headache or moderate pain.     [provider]  levothyroxine (SYNTHROID) 112 MCG tablet Take 112 mcg by mouth daily.    [provider]  lisinopril  (PRINIVIL ,ZESTRIL ) 40 MG tablet Take 40 mg by mouth at bedtime.     [provider]  mupirocin ointment (BACTROBAN) 2 % mupirocin 2 % topical ointment    [provider]  olmesartan (BENICAR) 20 MG tablet Take 20 mg by mouth daily.    [provider]  ondansetron  (ZOFRAN ) 4 MG tablet Take 1 tablet (4 mg total) by mouth every 6 (six) hours as needed for nausea. 01/26/19   Swinteck, Redell, MD  promethazine -dextromethorphan (PROMETHAZINE -DM) 6.25-15 MG/5ML syrup Take 5 mLs by mouth 4 (four) times daily as needed. 06/11/23   Stuart Vernell Norris, PA-C  rosuvastatin  (CRESTOR ) 10 MG tablet Take 10 mg by mouth daily.    [provider]  senna (SENOKOT) 8.6 MG TABS tablet Take 2 tablets (17.2 mg total) by mouth at bedtime. 04/27/17   Swinteck, Redell, MD    Family History Family History  Problem Relation Age of Onset   Emphysema Mother    Lung cancer Mother    Diabetes Mellitus II Father    Heart attack Father     Social History Social History   Tobacco Use   Smoking status: Former    Current packs/day: 0.00    Average packs/day: 0.5 packs/day for 15.0 years (7.5 ttl pk-yrs)    Types: Cigars, Cigarettes    Start date: 89    Quit date: 2000    Years since quitting: 25.4   Smokeless tobacco: Never  Vaping Use   Vaping status: Never Used  Substance Use Topics   Alcohol  use: No   Drug use: No     Allergies    Patient has no known allergies.   Review of Systems Review of Systems Per HPI  Physical Exam Triage Vital Signs ED Triage Vitals  Encounter Vitals Group     BP 01/05/24 1812 114/76     Girls Systolic BP Percentile --      Girls Diastolic BP Percentile --      Boys Systolic BP Percentile --      Boys Diastolic BP Percentile --      Pulse Rate 01/05/24 1812 (!) 58  Resp 01/05/24 1812 20     Temp 01/05/24 1812 98 F (36.7 C)     Temp Source 01/05/24 1812 Oral     SpO2 01/05/24 1812 94 %     Weight --      Height --      Head Circumference --      Peak Flow --      Pain Score 01/05/24 1806 0     Pain Loc --      Pain Education --      Exclude from Growth Chart --    No data found.  Updated Vital Signs BP 114/76 (BP Location: Right Arm)   Pulse (!) 58   Temp 98 F (36.7 C) (Oral)   Resp 20   SpO2 94%   Visual Acuity Right Eye Distance:   Left Eye Distance:   Bilateral Distance:    Right Eye Near:   Left Eye Near:    Bilateral Near:     Physical Exam Vitals and nursing note reviewed.  Constitutional:      General: She is not in acute distress.    Appearance: Normal appearance.  HENT:     Head: Normocephalic.     Right Ear: There is impacted cerumen.     Left Ear: There is impacted cerumen.     Ears:     Comments: Post ear irrigation, complete removal of cerumen bilaterally.  Patient tolerated the procedure well.    Nose: Nose normal.   Eyes:     Pupils: Pupils are equal, round, and reactive to light.   Pulmonary:     Effort: Pulmonary effort is normal.   Musculoskeletal:     Cervical back: Normal range of motion.   Skin:    General: Skin is warm and dry.   Neurological:     General: No focal deficit present.     Mental Status: She is alert and oriented to person, place, and time.   Psychiatric:        Mood and Affect: Mood normal.        Behavior: Behavior normal.      UC Treatments / Results  Labs (all labs ordered are listed,  but only abnormal results are displayed) Labs Reviewed - No data to display  EKG   Radiology No results found.  Procedures Procedures (including critical care time)  Medications Ordered in UC Medications - No data to display  Initial Impression / Assessment and Plan / UC Course  I have reviewed the triage vital signs and the nursing notes.  Pertinent labs & imaging results that were available during my care of the patient were reviewed by me and considered in my medical decision making (see chart for details).  Patient presents with bilateral cerumen impaction.  Complete removal of the earwax was removed via ear irrigation.  Patient tolerated the procedure well.  Supportive care recommendations were provided and discussed with the patient to include use of over-the-counter Debrox earwax softener and avoiding sticking anything inside of the ears while symptoms persist.  Patient was advised to follow-up in this clinic or with ENT in the future as needed for ear irrigation.  Patient was in agreement with this plan of care and verbalizes understanding.  All questions were answered.  Patient stable for discharge.  Final Clinical Impressions(s) / UC Diagnoses   Final diagnoses:  None   Discharge Instructions   None    ED Prescriptions   None    PDMP not reviewed  this encounter.   Gilmer Etta PARAS, NP 01/05/24 1859

## 2024-01-05 NOTE — ED Triage Notes (Signed)
 Pt reports bilateral ear fullness and intermittent soreness for last several weeks. Has tried ear flushing and otc drops at home with no change in symptoms.

## 2024-01-05 NOTE — Discharge Instructions (Signed)
 Recommend use of the over-the-counter Debrox earwax softener. Apply 1 to 2 drops in each ear 2-3 times weekly to keep the earwax soft. Do not use Q-tips as this makes symptoms worse and pushes wax further into the ear canal. Follow-up in this clinic as needed for earwax removal or ear irrigation. Follow-up as needed.

## 2024-03-20 ENCOUNTER — Ambulatory Visit: Payer: Self-pay

## 2024-03-20 NOTE — Telephone Encounter (Signed)
 Copied from CRM [id number]. Topic: Clinical - Red Word Triage >> Mar 20, 2024  1:26 PM Franky GRADE wrote: Red Word that prompted transfer to Nurse Triage: Patient has been experiencing a rapid heart beat, going up to 175 BPM. She is not sure if this is related but feels tired but generally no other symptoms. She is not established with a primary care provider within [place] and her current PCP's office is closed.

## 2024-03-20 NOTE — Telephone Encounter (Signed)
 FYI Only or Action Required?: FYI only for provider.  Patient was last seen in primary care on New Patient  Called Nurse Triage reporting Palpitations.  Symptoms began several months ago.  Interventions attempted: Rest, hydration, or home remedies.  Symptoms are: stable.  Triage Disposition: Home Care  Patient/caregiver understands and will follow disposition?: Yes  New patient appt. Scheduled for 07/27/24**       Reason for Disposition  Palpitations  Answer Assessment - Initial Assessment Questions 1. DESCRIPTION: Please describe your heart rate or heartbeat that you are having (e.g., fast/slow, regular/irregular, skipped or extra beats, palpitations)      Normal during the call    2. ONSET: When did it start? (e.g., minutes, hours, days)       Chronic problems with elevated HR. X 3 months   3. DURATION: How long does it last (e.g., seconds, minutes, hours)      Hours   4. PATTERN Does it come and go, or has it been constant since it started?  Does it get worse with exertion?   Are you feeling it now?     Intermittent, feeling normal during the call         6. HEART RATE: Can you tell me your heart rate? How many beats in 15 seconds?  Note: Not all patients can do this.       67 now  7. RECURRENT SYMPTOM: Have you ever had this before? If Yes, ask: When was the last time? and What happened that time?      Yes ongoing, has been seen in [facility] previously   8. CAUSE: What do you think is causing the palpitations?     Unknown   9. CARDIAC HISTORY: Do you have any history of heart disease? (e.g., heart attack, angina, bypass surgery, angioplasty, arrhythmia)      HTN, on medication   10. OTHER SYMPTOMS: Do you have any other symptoms? (e.g., dizziness, chest pain, sweating, difficulty breathing)  No.   Patient needs to establish care with [facility], an NP appt. Was scheduled.  Protocols used: Heart Rate and Heartbeat Questions-A-AH

## 2024-07-27 ENCOUNTER — Ambulatory Visit: Admitting: Physician Assistant
